# Patient Record
Sex: Female | Born: 1955 | Race: White | Hispanic: No | Smoking: Never smoker
Health system: Southern US, Community
[De-identification: ages and names within clinical notes are randomized; demographics above are authoritative.]

## PROBLEM LIST (undated history)

## (undated) DIAGNOSIS — F419 Anxiety disorder, unspecified: Secondary | ICD-10-CM

## (undated) DIAGNOSIS — F32A Depression, unspecified: Secondary | ICD-10-CM

## (undated) DIAGNOSIS — K219 Gastro-esophageal reflux disease without esophagitis: Secondary | ICD-10-CM

## (undated) DIAGNOSIS — G4733 Obstructive sleep apnea (adult) (pediatric): Secondary | ICD-10-CM

## (undated) DIAGNOSIS — G4730 Sleep apnea, unspecified: Secondary | ICD-10-CM

## (undated) DIAGNOSIS — Z9641 Presence of insulin pump (external) (internal): Secondary | ICD-10-CM

## (undated) DIAGNOSIS — F329 Major depressive disorder, single episode, unspecified: Secondary | ICD-10-CM

## (undated) DIAGNOSIS — T7840XA Allergy, unspecified, initial encounter: Secondary | ICD-10-CM

## (undated) DIAGNOSIS — D126 Benign neoplasm of colon, unspecified: Secondary | ICD-10-CM

## (undated) DIAGNOSIS — E785 Hyperlipidemia, unspecified: Secondary | ICD-10-CM

## (undated) DIAGNOSIS — D649 Anemia, unspecified: Secondary | ICD-10-CM

## (undated) HISTORY — DX: Hyperlipidemia, unspecified: E78.5

## (undated) HISTORY — DX: Anxiety disorder, unspecified: F41.9

## (undated) HISTORY — DX: Anemia, unspecified: D64.9

## (undated) HISTORY — DX: Gastro-esophageal reflux disease without esophagitis: K21.9

## (undated) HISTORY — DX: Sleep apnea, unspecified: G47.30

## (undated) HISTORY — PX: EYE SURGERY: SHX253

## (undated) HISTORY — DX: Depression, unspecified: F32.A

## (undated) HISTORY — PX: OTHER SURGICAL HISTORY: SHX169

## (undated) HISTORY — DX: Benign neoplasm of colon, unspecified: D12.6

## (undated) HISTORY — DX: Obstructive sleep apnea (adult) (pediatric): G47.33

## (undated) HISTORY — DX: Major depressive disorder, single episode, unspecified: F32.9

## (undated) HISTORY — PX: COLONOSCOPY: SHX174

## (undated) HISTORY — DX: Allergy, unspecified, initial encounter: T78.40XA

---

## 1998-01-11 ENCOUNTER — Other Ambulatory Visit: Admission: RE | Admit: 1998-01-11 | Discharge: 1998-01-11 | Payer: Self-pay | Admitting: Gynecology

## 1998-05-01 HISTORY — PX: CARPAL TUNNEL RELEASE: SHX101

## 1999-02-17 ENCOUNTER — Other Ambulatory Visit: Admission: RE | Admit: 1999-02-17 | Discharge: 1999-02-17 | Payer: Self-pay | Admitting: Gynecology

## 1999-03-03 ENCOUNTER — Encounter: Admission: RE | Admit: 1999-03-03 | Discharge: 1999-06-01 | Payer: Self-pay | Admitting: Endocrinology

## 2000-01-11 ENCOUNTER — Ambulatory Visit (HOSPITAL_BASED_OUTPATIENT_CLINIC_OR_DEPARTMENT_OTHER): Admission: RE | Admit: 2000-01-11 | Discharge: 2000-01-11 | Payer: Self-pay | Admitting: Orthopedic Surgery

## 2000-03-01 ENCOUNTER — Other Ambulatory Visit: Admission: RE | Admit: 2000-03-01 | Discharge: 2000-03-01 | Payer: Self-pay | Admitting: Gynecology

## 2000-03-28 ENCOUNTER — Ambulatory Visit (HOSPITAL_BASED_OUTPATIENT_CLINIC_OR_DEPARTMENT_OTHER): Admission: RE | Admit: 2000-03-28 | Discharge: 2000-03-28 | Payer: Self-pay | Admitting: Orthopedic Surgery

## 2001-03-21 ENCOUNTER — Other Ambulatory Visit: Admission: RE | Admit: 2001-03-21 | Discharge: 2001-03-21 | Payer: Self-pay | Admitting: Gynecology

## 2002-04-01 ENCOUNTER — Other Ambulatory Visit: Admission: RE | Admit: 2002-04-01 | Discharge: 2002-04-01 | Payer: Self-pay | Admitting: Gynecology

## 2003-03-05 ENCOUNTER — Ambulatory Visit (HOSPITAL_BASED_OUTPATIENT_CLINIC_OR_DEPARTMENT_OTHER): Admission: RE | Admit: 2003-03-05 | Discharge: 2003-03-05 | Payer: Self-pay | Admitting: Orthopedic Surgery

## 2003-03-05 ENCOUNTER — Ambulatory Visit (HOSPITAL_COMMUNITY): Admission: RE | Admit: 2003-03-05 | Discharge: 2003-03-05 | Payer: Self-pay | Admitting: Orthopedic Surgery

## 2003-04-28 ENCOUNTER — Other Ambulatory Visit: Admission: RE | Admit: 2003-04-28 | Discharge: 2003-04-28 | Payer: Self-pay | Admitting: Gynecology

## 2004-04-27 ENCOUNTER — Other Ambulatory Visit: Admission: RE | Admit: 2004-04-27 | Discharge: 2004-04-27 | Payer: Self-pay | Admitting: Gynecology

## 2004-09-14 ENCOUNTER — Ambulatory Visit (HOSPITAL_COMMUNITY): Admission: RE | Admit: 2004-09-14 | Discharge: 2004-09-14 | Payer: Self-pay | Admitting: Orthopedic Surgery

## 2004-09-14 ENCOUNTER — Ambulatory Visit (HOSPITAL_BASED_OUTPATIENT_CLINIC_OR_DEPARTMENT_OTHER): Admission: RE | Admit: 2004-09-14 | Discharge: 2004-09-14 | Payer: Self-pay | Admitting: Orthopedic Surgery

## 2005-05-01 HISTORY — PX: APPENDECTOMY: SHX54

## 2005-06-19 ENCOUNTER — Other Ambulatory Visit: Admission: RE | Admit: 2005-06-19 | Discharge: 2005-06-19 | Payer: Self-pay | Admitting: Gynecology

## 2005-10-04 ENCOUNTER — Other Ambulatory Visit: Admission: RE | Admit: 2005-10-04 | Discharge: 2005-10-04 | Payer: Self-pay | Admitting: Gynecology

## 2006-02-22 ENCOUNTER — Other Ambulatory Visit: Admission: RE | Admit: 2006-02-22 | Discharge: 2006-02-22 | Payer: Self-pay | Admitting: Gynecology

## 2006-07-05 ENCOUNTER — Other Ambulatory Visit: Admission: RE | Admit: 2006-07-05 | Discharge: 2006-07-05 | Payer: Self-pay | Admitting: Gynecology

## 2006-08-14 ENCOUNTER — Inpatient Hospital Stay (HOSPITAL_COMMUNITY): Admission: EM | Admit: 2006-08-14 | Discharge: 2006-08-16 | Payer: Self-pay | Admitting: Emergency Medicine

## 2006-08-15 ENCOUNTER — Encounter (INDEPENDENT_AMBULATORY_CARE_PROVIDER_SITE_OTHER): Payer: Self-pay | Admitting: Specialist

## 2007-07-10 ENCOUNTER — Ambulatory Visit: Payer: Self-pay | Admitting: Internal Medicine

## 2007-08-19 ENCOUNTER — Other Ambulatory Visit: Admission: RE | Admit: 2007-08-19 | Discharge: 2007-08-19 | Payer: Self-pay | Admitting: Gynecology

## 2007-08-23 ENCOUNTER — Ambulatory Visit: Payer: Self-pay | Admitting: Internal Medicine

## 2007-08-23 ENCOUNTER — Encounter: Payer: Self-pay | Admitting: Internal Medicine

## 2010-09-13 NOTE — Assessment & Plan Note (Signed)
[facility]                         GASTROENTEROLOGY OFFICE NOTE   Emily Combs, Emily Combs                     MRN:          [id number]  DATE:07/10/2007                            DOB:          10-Jun-1955    REFERRING PHYSICIAN:  Tera Mater. [name], M.D.   REASON FOR CONSULTATION:  Screening colonoscopy in a type I insulin-  requiring diabetic.   HISTORY:  This is a pleasant 55 year old white female with longstanding  type 1 diabetes mellitus. She is referred through the courtesy of Dr.  [name] regarding screening colonoscopy. The patient denies any active  gastrointestinal complaints such as abdominal pain, change in bowel  habits, or bleeding. She has not undergone prior screening. She does  have history of colon cancer in two second-degree relatives including  her paternal grandmother and maternal uncle. For her diabetes, the  patient is on an insulin pump.   PAST MEDICAL HISTORY:  Type 1 diabetes.   PAST SURGICAL HISTORY:  Status post appendectomy.   ALLERGIES:  No known drug allergies.   CURRENT MEDICATIONS:  1. Vytorin 10/20 mg daily.  2. Novolog insulin pump.  3. Symlin 15 units before meals.  4. Aspirin 325 mg daily.  5. Aleve 3 each morning.   FAMILY HISTORY:  Paternal grandmother with colon cancer and maternal  uncle with colon cancer.   SOCIAL HISTORY:  The patient is separated with one son. 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 She  does not smoke or use alcohol.   REVIEW OF SYSTEMS:  Per diagnostic evaluation form.   PHYSICAL EXAMINATION:  Well-appearing female in no acute distress. Blood  pressure is 96/56. Heart rate is 80. Weight is 150.6 pounds. She is 5  feet 3 inches in height.  HEENT:  Sclerae anicteric. Conjunctivae are pink. There is no  adenopathy.  LUNGS:  Clear.  HEART:  Regular.  ABDOMEN:  Soft without tenderness, masses, or hernia. Good bowel sounds  heard.   IMPRESSION:  1. Screening colonoscopy. The patient is an appropriate candidate      without contraindication. The nature of the procedure as well as      the risks, benefits, and alternatives were reviewed. She understood      and agreed to proceed.  2. Type 1 insulin-requiring diabetic.  3. Family history of colon cancer in two second-degree relatives.   RECOMMENDATIONS:  The patient is agreeable and interested in colonoscopy  as discussed above. With regards to her insulin, some adjustments may  need to be made to avoid unwanted hypoglycemia. She will discuss this  specifically with Dr. [name].     Wilhemina Bonito. Marina Goodell, MD  Electronically Signed    JNP/MedQ  DD: 07/10/2007  DT: 07/11/2007  Job #: [id number]   cc:   Jeannett Senior A. [name], M.D.

## 2010-09-16 NOTE — Op Note (Signed)
Emily Combs, Emily Combs              ACCOUNT NO.:  [id number]   MEDICAL RECORD NO.:  [id number]          PATIENT TYPE:  AMB   LOCATION:  DSC                          FACILITY:  [facility]   PHYSICIAN:  Cindee Salt, M.D.       DATE OF BIRTH:  Aug 07, 1955   DATE OF PROCEDURE:  09/14/2004  DATE OF DISCHARGE:                                 OPERATIVE REPORT   PREOPERATIVE DIAGNOSIS:  Stenosing tenosynovitis, right index and right ring  finger.   POSTOPERATIVE DIAGNOSIS:  Stenosing tenosynovitis, right index and right  ring finger.   OPERATION:  Release A1 pulley, right ring and right index finger.   SURGEON:  Cindee Salt, M.D.   ASSISTANT:  Carolyne Fiscal, R.N.   ANESTHESIA:  Forearm-based IV regional.   HISTORY:  The patient is a 55 year old female with a history of triggering  of her right index and  right ring fingers. She is a diabetic and had  elected to proceed with releases rather than injections.   PROCEDURE:  The patient was brought to the operating room where forearm-  based IV regional anesthetic was carried out without difficulty. She was  prepped using DuraPrep, supine position, the right arm free.  An oblique  incision was made over each one of the metacarpophalangeal joints of the  index and ring fingers separately, carried down through subcutaneous tissue.  Neurovascular structures identified, protected, and retracted. Significant  fibrosis was present around the entire area. The A1 pulley was then  isolated.  Each was released on the radial aspect and a small incision made  in the central aspect of the A2 pulley.  The fingers were placed through a  full range motion.  No further triggering was evident. The wounds were  separately irrigated with saline and separately closed with interrupted 5-0  nylon sutures. A sterile compressive dressing was applied. The patient  tolerated the procedure well and was taken to the recovery room for  observation in satisfactory condition. She is  discharged home to return to  the [facility] [place] in one week on Vicodin.      GK/MEDQ  D:  09/14/2004  T:  09/14/2004  Job:  [id number]

## 2010-09-16 NOTE — H&P (Signed)
Emily Combs, Emily Combs              ACCOUNT NO.:  [id number]   MEDICAL RECORD NO.:  [id number]          PATIENT TYPE:  EMS   LOCATION:  MAJO                         FACILITY:  [facility]   PHYSICIAN:  Cherylynn Ridges, M.D.    DATE OF BIRTH:  09-22-1955   DATE OF ADMISSION:  08/14/2006  DATE OF DISCHARGE:                              HISTORY & PHYSICAL   CHIEF COMPLAINT:  The patient is a 55 year old female who comes in with  x-rays demonstrating acute appendicitis and an appendolith.   HISTORY OF PRESENT ILLNESS:  The patient is an insulin pump dependent  diabetic treated by Dr. [name]. She has had abdominal pain for  approximately 24 hours.  Went in to today to have a CT scan done late in  the day.  She had nausea but not any vomiting.  When she was seen by  primary care, she was sent for a CT scan which was finished after hours.  It demonstrated acute appendicitis with an appendolith and surgery was  consulted.   PAST MEDICAL HISTORY:  Significant for insulin pump dependent diabetes  mellitus, currently 1.625 units per hour with sugar at 83.  She tells me  she takes no other medications.   ALLERGIES:  NO KNOWN DRUG ALLERGIES.   PAST SURGICAL HISTORY:  She has had a cesarean section before, no other  surgery.   MEDICATIONS:  Include insulin in her insulin pump. She takes no long  term or long acting insulin.   She has had no diarrhea or constipation.   PHYSICAL EXAMINATION:  She is afebrile.  Her other vital signs are  stable.  She is normocephalic, atraumatic. Nonicteric.  NECK:  Supple.  CHEST:  Clear to auscultation and percussion.  CARDIAC EXAM:  Regular rate and rhythm.  No rubs, murmurs or gallops.  ABDOMEN:  Soft and tender in the right lower quadrant and mid portion.  Her insulin pump is inserted on the left side where she moved it today  after experiencing the pain on the right side thinking that it may be a  pump associated abscess.  She has guarding and tenderness and a  positive  Rovsing's sign.  RECTAL EXAM:  Not performed.   LABORATORY DATA:  White count is 15,000.  Hemoglobin is elevated.  Her  hematocrit I believe is 36.  BUN creatinine were normal.  I reviewed the  CT scan brought with her by disc which demonstrates thickened appendix  in the right lower quadrant.   PLAN:  Plan is to perform a laparoscopic appendectomy.  This may need to  be converted to an open based on the size of the appendix and the amount  of the acute inflammation.  She will get preoperative Unasyn therapy and  continue that postoperatively depending on the amount of inflammation  and whether or not this is ruptured.  Currently this does not appear to  be ruptured.  She will be maintained in the hospital and started back on  her insulin pump.  Postoperatively with the D5LR drip being run.      Cherylynn Ridges, M.D.  Electronically Signed     JOW/MEDQ  D:  08/14/2006  T:  08/15/2006  Job:  [id number]   cc:   Geoffry Paradise, M.D.  Tera Mater. [name], M.D.

## 2010-09-16 NOTE — Op Note (Signed)
[place]. [facility]  Patient:    Emily Combs, Emily Combs                     MRN: [id number] Proc. Date: 03/28/00 Attending:  Ronne Binning                           Operative Report  PREOPERATIVE DIAGNOSIS:  Carpal tunnel syndrome, left hand.  POSTOPERATIVE DIAGNOSIS:  Carpal tunnel syndrome, left hand.  OPERATION:  Carpal tunnel release, left hand.  SURGEON:  Nicki Reaper, M.D.  ASSISTANT:  Joaquin Courts, R.N.  ANESTHESIA:  Forearm-based IV regional.  ANESTHESIOLOGIST:  Edwin Cap. Zoila Shutter, M.D.  HISTORY:  The patient is a 55 year old female with a history of carpal tunnel syndrome, EMG nerve conductions positive, which has not responded to conservative treatment.  DESCRIPTION OF PROCEDURE:  The patient was brought to the operating room, where a forearm-based IV regional anesthetic was carried out without difficulty.  She was prepped and draped using Betadine scrub and solution with the left arm free.  A longitudinal incision was made in the palm, carried down through subcutaneous tissue.  Bleeders were electrocauterized.  Palmar fascia was clipped.  Superficial palmar arch identified.  The flexor tendons of the ring and little finger identified to the ulnar side of the median nerve.  The carpal retinaculum was incised with sharp dissection.  A right angle and Sewell retractor were placed between the skin and forearm fascia.  The fascia was released for approximately 3 cm proximal to the wrist crease under direct vision.  The canal was explored and no further lesions were identified. Tenosynovial tissue was moderately thickened.  The wound was irrigated, and the skin was closed with interrupted 5-0 nylon suture.  A sterile compressive dressing and splint were applied.  The patient tolerated the procedure well and was taken to the recovery room for observation in satisfactory condition. She is discharged home, to return to the [facility] of  [place] in one week, on Vicodin and Keflex. DD:  03/28/00 TD:  03/28/00 Job: [id number]

## 2010-09-16 NOTE — Discharge Summary (Signed)
Emily Combs, [name]              ACCOUNT NO.:  [id number]   MEDICAL RECORD NO.:  [id number]          PATIENT TYPE:  INP   LOCATION:  5511                         FACILITY:  [facility]   PHYSICIAN:  Anselm Pancoast. Weatherly, M.D.DATE OF BIRTH:  [birth year]   DATE OF ADMISSION:  08/14/2006  DATE OF DISCHARGE:  08/16/2006                               DISCHARGE SUMMARY   CHIEF COMPLAINT AND REASON FOR ADMISSION:  The patient is a 55 year old  female patient insulin-dependent diabetes on a pump, followed by Dr.  [name] who presented to the ER with abdominal pain for 24 hours.  Came to  the hospital to have a CT scan done, this was done after hours.  The CT  scan demonstrated acute appendicitis with an appendicolith.  She was  eventually evaluated in the ER at [facility].  Her white cell count was  elevated at 15,000.  CT scan was reviewed by Dr. [name] and he felt she  had a thickened appendix in the right lower quadrant consistent with  acute appendicitis.  The patient was admitted with a diagnosis of acute  appendicitis as well as known insulin dependent diabetes on a pump.   HOSPITAL COURSE:  The patient was taken from the ER to the OR with plans  for her to proceed with laparoscopic appendectomy.  She was given Unasyn  preoperatively.  She subsequently underwent a laparoscopic appendectomy  with the diagnosis of acute nonperforated appendicitis with  appendicolith.  She was stable and sent to the general floor to recover.   The following morning the patient was afebrile, vital signs were stable.  She was hungry but sore.  Her abdomen was soft, bowel sounds were  present.  She had already been tolerating a clear liquid diet.  Her diet  was advanced.  She was given Toradol and started on oral pain  medications.  She tolerated this and was deemed appropriate for  discharge home.   FINAL DISCHARGE DIAGNOSES:  1. Acute nonperforated appendicitis.  2. Status post laparoscopic appendectomy.  3.  Insulin dependent diabetes on IV insulin pump.   DISCHARGE MEDICATIONS:  1. Vytorin at prior dose.  2. Aspirin 81 mg daily.  3. Naprosyn 3 pills daily or over-the-counter ibuprofen as needed in      addition to her Vicodin.  4. Insulin pump as previous.  5. Sinulin as before.  6. Vicodin 5/325 1-2 tablets every 4 hours as needed for pain.   Return to work in 2 weeks, sooner if you desire and1 week if you are not  requiring use of Vicodin for pain.   DIET:  Low sodium heart-healthy with diabetic low carb precautions.   ACTIVITY:  Increase activity slowly.  May walk up steps, may shower.  No  lifting greater than 15 pounds for 2 weeks, no driving while taking the  Vicodin.   WOUND CARE:  Allow Steri-Strips to fall off.   OTHER INSTRUCTIONS:  Call surgeon if:  A.  Fever greater than or equal  to 101 degrees Fahrenheit.  B. Nausea, vomiting, diarrhea.  C.  New or  increased belly pain.  D.  Redness or drainage from the wounds.   In regards to work, as listed above and may return to work after  discharge of works at home with limited lifting.   FOLLOW UP:  You need to call Dr. [name] at [phone] to be seen in 2  weeks.      Allison L. Rennis Harding, N.P.    ______________________________  Anselm Pancoast. Zachery Dakins, M.D.    ALE/MEDQ  D:  10/03/2006  T:  10/03/2006  Job:  [id number]   cc:   Anselm Pancoast. Zachery Dakins, M.D.  Dr. [name]

## 2010-09-16 NOTE — Op Note (Signed)
[place]. [facility]  Patient:    Emily Combs, Emily Combs                     MRN: [id number] Proc. Date: 01/11/00 Adm. Date:  [id number] Attending:  Ronne Binning                           Operative Report  PREOPERATIVE DIAGNOSIS:  Carpal tunnel syndrome, right hand.  POSTOPERATIVE DIAGNOSIS:  Carpal tunnel syndrome, right hand.  OPERATION:  Release right carpal tunnel.  SURGEON:  Nicki Reaper, M.D.  ASSISTANT:  Joaquin Courts, R.N.  ANESTHESIA:  Foreign based IV regional.  ANESTHESIOLOGIST:  Halford Decamp, M.D.  HISTORY:  The patient is a 55 year old female with a history of carpal tunnel syndrome.  EMG nerve conductions positive which have not responded to conservative treatment.  DESCRIPTION OF PROCEDURE:  The patient was brought to the operating room where a foreign based IV regional anesthetic was carried out without difficulty. She was prepped and draped using Betadine scrub and solution with the right arm free.  A longitudinal incision was made in the palm and carried down through the subcutaneous tissues.  Bleeders were electrocauterized.  Palmar fascia was split.  The superficial palmar arch was identified.  The flexor tendon to the ring and little finger were identified.  At the ulnar side of the median nerve, the carpal retinaculum was incised with sharp dissection. Right angle and Sewall retractor were placed between skin and forearm fascia. The fascia was released for approximately 3 cm proximal to the wrist crease and this was not fully visualized.  A small incision was made proximally to allow completion of the release of the distal forearm fascia under direct vision.  The canal was explored.  No further lesions were identified.  The wound was irrigated.  The skin was closed with interrupted 5-0 nylon suture. Sterile compressive dressing and splint was applied.  The patient tolerated the procedure well and was taken to the recovery  room for observation in satisfactory condition.  She was discharged home to return to the [facility] [place] in one week on Vicodin and Keflex. DD:  01/11/00 TD:  01/12/00 Job: [id number]

## 2010-09-16 NOTE — Op Note (Signed)
Emily Combs, Emily Combs              ACCOUNT NO.:  [id number]   MEDICAL RECORD NO.:  [id number]          PATIENT TYPE:  EMS   LOCATION:  MAJO                         FACILITY:  [facility]   PHYSICIAN:  Cherylynn Ridges, M.D.    DATE OF BIRTH:  1956/03/07   DATE OF PROCEDURE:  08/15/2006  DATE OF DISCHARGE:                               OPERATIVE REPORT   PREOPERATIVE DIAGNOSIS:  Acute appendicitis with large appendicolith.   POSTOPERATIVE DIAGNOSIS:  Acute appendicitis with large appendicolith  without rupture.   PROCEDURE:  Laparoscopic appendectomy.   SURGEON:  Cherylynn Ridges, M.D.   ANESTHESIA:  General endotracheal.   ESTIMATED BLOOD LOSS:  Less than 30 mL.   COMPLICATIONS:  None.   CONDITION:  Stable.   INDICATIONS FOR OPERATION:  The patient is a 55 year old who came with a  CT scan demonstrating acute appendicitis with an appendicolith.   FINDINGS:  The patient had a large appendix with acute inflammation.  No  evidence of rupture.   OPERATION:  The patient was taken to the operating room, placed on table  in supine position.  After an adequate general anesthetic was  administered, she was prepped and draped in usual sterile manner  exposing the midline and right upper quadrant.   A supraumbilical curvilinear incision was made using #11 blade taken  down to the midline fascia.  We grabbed the fascia with Kocher clamps  and split it between with a 15 blade down into the preperitoneal fat.  We tented up on the fascia using Kocher clamps and then bluntly  dissected down into the peritoneal cavity with a Kelly clamp.   We passed a pursestring suture of 0-0 Vicryl around the fascial incision  and passed Hasson cannula into the peritoneal cavity.  We secured it in  place with a pursestring.  Once this was done, a right upper quadrant 5-  mm cannula and a suprapubic 12-mm cannula passed under direct vision  into the peritoneal cavity.  The one in the suprapubic area took some  force and dissection to get through because of previous scarring from  previous C-section.   The patient had no hematuria during the case to indicate possible  bladder injury with trocar, none was noted.   The appendix was large and attached to the lateral walls as was the  cecum.  We dissected out the lateral attachments using electrocautery  then dissected out the base of the appendix and separated it away from  the base of the cecum.  We came across the base using an Endo-GIA with  3.5-mm closure staples.  We then dissected out the mesoappendix and came  across it with 2.5 mm Endo-GIA where there was excellent hemostasis.  We  used an EndoCatch bag to bring it out through the suprapubic site with  minimal difficulty without tearing the bag or rupture or contamination  of the subcutaneous tissue.   Once this was done we used about 2 liters saline to irrigate the right  lower quadrant aspirated all fluid.  Once this was done, we removed all  cannulas.  The supraumbilical  fascial site was closed using the  pursestring suture which was in place.  0.25% Marcaine with epi was  injected all sites and all sites were closed using running subcuticular  stitch of 5-0 Vicryl.  All needle counts, sponge counts and instrument  counts were correct.  Sterile dressings were applied.      Cherylynn Ridges, M.D.  Electronically Signed     JOW/MEDQ  D:  08/15/2006  T:  08/15/2006  Job:  [id number]

## 2010-09-16 NOTE — Op Note (Signed)
[name], Emily Combs                        ACCOUNT NO.:  [id number]   MEDICAL RECORD NO.:  [id number]                   PATIENT TYPE:  AMB   LOCATION:  DSC                                  FACILITY:  [facility]   PHYSICIAN:  Cindee Salt, M.D.                    DATE OF BIRTH:  07/27/1955   DATE OF PROCEDURE:  03/05/2003  DATE OF DISCHARGE:                                 OPERATIVE REPORT   PREOPERATIVE DIAGNOSIS:  Stenosing tenosynovitis, left index and left ring  fingers.   POSTOPERATIVE DIAGNOSIS:  Stenosing tenosynovitis, left index and left ring  fingers.   OPERATION:  Release of A-1 pulleys, left index and left ring fingers.   SURGEON:  Cindee Salt, M.D.   ASSISTANT:  Alfredo Bach.   ANESTHESIA:  Forearm-based IV regional.   HISTORY:  The patient is a 55 year old female with a history of triggering  of her left index and left ring fingers which has not responded to  conservative treatment.  She also has a proximal fibroma on the ring finger  which may represent an early Dupuytren's.  We have discussed possible  excision of this with her and have decided against resection of that  component at this point in time.   PROCEDURE:  The patient was brought to the operating room where a forearm-  based IV regional anesthetic was carried out without difficulty.  She was  prepped using DuraPrep, supine position, left arm free.  An oblique incision  was made over the A-1 pulleys at the left ring and left index finger and  carried down through subcutaneous tissue.  Bleeders were electrocauterized  and neurovascular structures on both radial and ulnar sides of each flexor  sheath were protected.  An incision was made on the radial aspect of the A-1  pulley.  The A-1 pulley was entirely released.  A small incision was made in  the central aspect of the A-2 pulley, the finger placed through a full range  of motion and no further triggering was identified.  It was noted that the  pulleys  were extremely thick on both index and ring fingers; each were  released separately.  The wound was irrigated.  The skin was closed with  interrupted 5-0 nylon suture.  A sterile compressive dressing was applied.  The patient tolerated the procedure well and was taken to the recovery room  for observation in satisfactory condition.   She is discharged home to return to the [facility] [place] in one  week, on Vicodin and Keflex.                                               Cindee Salt, M.D.    [name]  D:  03/05/2003  T:  03/05/2003  Job:  [id number]

## 2010-10-08 ENCOUNTER — Encounter: Payer: Self-pay | Admitting: Internal Medicine

## 2011-01-30 HISTORY — PX: WRIST SURGERY: SHX841

## 2011-02-15 ENCOUNTER — Encounter (HOSPITAL_BASED_OUTPATIENT_CLINIC_OR_DEPARTMENT_OTHER)
Admission: RE | Admit: 2011-02-15 | Discharge: 2011-02-15 | Disposition: A | Discharge status: Home / Self Care | Payer: BC Managed Care – PPO | Source: Ambulatory Visit | Attending: Orthopedic Surgery | Admitting: Orthopedic Surgery

## 2011-02-15 LAB — BASIC METABOLIC PANEL
Calcium: 10 mg/dL (ref 8.4–10.5)
GFR calc non Af Amer: >90 mL/min (ref >90)
Glucose, Bld: 177 mg/dL — ABNORMAL HIGH (ref 70–99)
Sodium: 137 mEq/L (ref 135–145)

## 2011-02-17 ENCOUNTER — Ambulatory Visit (HOSPITAL_BASED_OUTPATIENT_CLINIC_OR_DEPARTMENT_OTHER)
Admission: RE | Admit: 2011-02-17 | Discharge: 2011-02-17 | Disposition: A | Discharge status: Home / Self Care | Payer: BC Managed Care – PPO | Source: Ambulatory Visit | Attending: Orthopedic Surgery | Admitting: Orthopedic Surgery

## 2011-02-17 DIAGNOSIS — E669 Obesity, unspecified: Secondary | ICD-10-CM | POA: Insufficient documentation

## 2011-02-17 DIAGNOSIS — Z794 Long term (current) use of insulin: Secondary | ICD-10-CM | POA: Insufficient documentation

## 2011-02-17 DIAGNOSIS — Z01812 Encounter for preprocedural laboratory examination: Secondary | ICD-10-CM | POA: Insufficient documentation

## 2011-02-17 DIAGNOSIS — Z9641 Presence of insulin pump (external) (internal): Secondary | ICD-10-CM | POA: Insufficient documentation

## 2011-02-17 DIAGNOSIS — E119 Type 2 diabetes mellitus without complications: Secondary | ICD-10-CM | POA: Insufficient documentation

## 2011-02-17 DIAGNOSIS — M65839 Other synovitis and tenosynovitis, unspecified forearm: Secondary | ICD-10-CM | POA: Insufficient documentation

## 2011-02-17 DIAGNOSIS — M65849 Other synovitis and tenosynovitis, unspecified hand: Secondary | ICD-10-CM | POA: Insufficient documentation

## 2011-02-17 LAB — POCT HEMOGLOBIN-HEMACUE: Hemoglobin: 12.1 g/dL (ref 12.0–15.0)

## 2011-03-02 NOTE — Op Note (Signed)
NAMEJAMILYNN, WHITACRE                ACCOUNT NO.:  [id number]  MEDICAL RECORD NO.:  [id number]  LOCATION:                                 FACILITY:  PHYSICIAN:  Katy Fitch. Orianna Biskup, M.D.      DATE OF BIRTH:  DATE OF PROCEDURE:  02/17/2011 DATE OF DISCHARGE:                              OPERATIVE REPORT   PREOPERATIVE DIAGNOSIS:  Chronic stenosing tenosynovitis, right first dorsal compartment, associated with insulin-dependent diabetes, status post unsuccessful injection, January 04, 2011, followed by prolonged splinting.  POSTOPERATIVE DIAGNOSIS:  Chronic stenosing tenosynovitis, right first dorsal compartment, associated with insulin-dependent diabetes, status post unsuccessful injection, January 04, 2011, followed by prolonged splinting.  OPERATION:  Release of right first dorsal compartment.  OPERATING SURGEON:  Katy Fitch. Ruari Mudgett, MD  ASSISTANT:  Marveen Reeks Dasnoit, PA-C  ANESTHESIA:  2% lidocaine, field block, supplemented by IV sedation.  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 Adrian Prince, for evaluation and management of chronic stenosing tenosynovitis of her right first dorsal compartment.  She is a insulin-dependent diabetic who uses an insulin pump.  She has had prior release of her left first dorsal compartment with an excellent result.  She now presents with a painful right first dorsal compartment with a positive Finkelstein maneuver.  We tried nonoperative measures including a steroid injection and splinting.  This was unsuccessful.  She now presents for release of a right first dorsal compartment under local anesthesia and sedation.  PROCEDURE:  [name] was brought to room 1 of the [facility] and placed in supine position upon the operating table. Following anesthesia and informed consent with Dr. Sampson Goon,  monitored anesthesia care was recommended and accepted.  After informed consent and Betadine prep, 2% lidocaine was infiltrated into the path intended incision at the first dorsal compartment and in the compartment proper. After a few moments, anesthesia was satisfactory.  The right arm and hand were then prepped with Betadine soap and solution, and sterilely draped.  A pneumatic tourniquet was applied to the proximal right brachium.  Following exsanguination of the right arm with an Esmarch bandage, the arterial tourniquet was inflated to 250 mmHg due to mild systolic hypertension.  Following routine surgical time-out, procedure commenced with a short transverse incision over the apex of the swollen compartment.  The subcutaneous tissues were carefully divided taking care to gently identify and retract the radial superficial sensory branches.  The compartment was then split longitudinally with scalpel and scissors.  The contents of the compartment were studied.  There was a large slip of the abductor pollicis longus and a smaller slip of the extensor pollicis brevis.  There was a septum separating the 2 tendon compartments that was incised and resected with a rongeur.  Bleeding points were electrocauterized, followed by repair of the skin with intradermal 3-0 Prolene.  A compressive dressing was applied with a Steri-Strip, sterile gauze, and Ace bandage.  We will encourage Ms. Minogue to begin immediate range of motion exercises to her fingers, thumb, and wrist.  We will see her back for followup  in 1 week for suture removal.  She will continue with a routine glucose control with her insulin pump.  Her preoperative serum glucose was 191.  Her insulin pump was running while she was on lactated Ringer's IV solution intraoperatively.     Katy Fitch Camielle Sizer, M.D.     RVS/MEDQ  D:  02/17/2011  T:  02/17/2011  Job:  [id number]  cc:   Jeannett Senior A. [name], M.D.  Electronically Signed by  Josephine Igo M.D. on 03/02/2011 05:19:46 PM

## 2011-03-30 ENCOUNTER — Encounter: Payer: Self-pay | Admitting: Internal Medicine

## 2011-04-20 ENCOUNTER — Telehealth: Payer: Self-pay | Admitting: *Deleted

## 2011-04-20 ENCOUNTER — Ambulatory Visit (AMBULATORY_SURGERY_CENTER): Payer: BC Managed Care – PPO | Admitting: *Deleted

## 2011-04-20 VITALS — Ht 63.0 in | Wt 168.0 lb

## 2011-04-20 DIAGNOSIS — Z1211 Encounter for screening for malignant neoplasm of colon: Secondary | ICD-10-CM

## 2011-04-20 MED ORDER — PEG-KCL-NACL-NASULF-NA ASC-C 100 G PO SOLR: ORAL | Status: DC

## 2011-04-20 NOTE — Telephone Encounter (Signed)
Pt. Having colon on 05/04/11 and has an insulin pump.  Please contact PCP for instructions on regulating insulin .  Thank you.  Wyona Almas

## 2011-04-21 ENCOUNTER — Telehealth: Payer: Self-pay

## 2011-04-21 NOTE — Telephone Encounter (Signed)
Left message with [place] regarding insulin pump instructions for patient before colon

## 2011-05-02 HISTORY — PX: CATARACT EXTRACTION: SUR2

## 2011-05-04 ENCOUNTER — Ambulatory Visit (AMBULATORY_SURGERY_CENTER): Payer: BC Managed Care – PPO | Admitting: Internal Medicine

## 2011-05-04 ENCOUNTER — Encounter: Payer: Self-pay | Admitting: Internal Medicine

## 2011-05-04 VITALS — BP 112/68 | HR 94 | Temp 98.2°F | Resp 20 | Ht 63.0 in | Wt 168.0 lb

## 2011-05-04 DIAGNOSIS — Z1211 Encounter for screening for malignant neoplasm of colon: Secondary | ICD-10-CM

## 2011-05-04 DIAGNOSIS — D126 Benign neoplasm of colon, unspecified: Secondary | ICD-10-CM

## 2011-05-04 DIAGNOSIS — Z86010 Personal history of colonic polyps: Secondary | ICD-10-CM

## 2011-05-04 LAB — GLUCOSE, CAPILLARY: Glucose-Capillary: 129 mg/dL — ABNORMAL HIGH (ref 70–99)

## 2011-05-04 MED ORDER — SODIUM CHLORIDE 0.9 % IV SOLN: 500.0000 mL | INTRAVENOUS | Status: DC

## 2011-05-04 NOTE — Op Note (Signed)
[facility] [address] [place], [place]  [address]  COLONOSCOPY PROCEDURE REPORT  PATIENT:  Malloree, Raboin  MR#:  [id number] BIRTHDATE:  12-Mar-1956, 55 yrs. old  GENDER:  female ENDOSCOPIST:  Wilhemina Bonito. Eda Keys, MD REF. BY:  Surveillance Program Recall, PROCEDURE DATE:  05/04/2011 PROCEDURE:  Colonoscopy with snare polypectomy x 1 ASA CLASS:  Class III INDICATIONS:  history of pre-cancerous (adenomatous) colon polyps, surveillance and high-risk screening ; index exam 07-2007 w/ 15mm TVA MEDICATIONS:   MAC sedation, administered by CRNA, propofol (Diprivan) 300 mg IV  DESCRIPTION OF PROCEDURE:   After the risks benefits and alternatives of the procedure were thoroughly explained, informed consent was obtained.  Digital rectal exam was performed and revealed no abnormalities.   The LB CF-[id number] endoscope was introduced through the anus and advanced to the cecum, which was identified by both the appendix and ileocecal valve, without limitations.  The quality of the prep was good, using MoviPrep. The instrument was then slowly withdrawn as the colon was fully examined. <<PROCEDUREIMAGES>>  FINDINGS:  A diminutive polyp was found in the proximal transverse colon and snared without cautery. Retrieval was successful. Otherwise normal colonoscopy without other polyps, masses, vascular ectasias, or inflammatory changes.   Retroflexed views in the rectum revealed internal hemorrhoids.    The time to cecum = 3:08 minutes. The scope was then withdrawn in 17:14  minutes from the cecum and the procedure completed.  COMPLICATIONS:  None  ENDOSCOPIC IMPRESSION: 1) Diminutive polyp in the proximal transverse colon - removed 2) Otherwise normal colonoscopy 3) Small Internal hemorrhoids  RECOMMENDATIONS: 1) Follow up colonoscopy in 5 years  ______________________________ Wilhemina Bonito. Eda Keys, MD  CC:  Adrian Prince, MD;  The Patient  n. eSIGNED:   Wilhemina Bonito. Eda Keys at  05/04/2011 09:14 AM  Elson, Gavin Pound, [id number]

## 2011-05-04 NOTE — Progress Notes (Signed)
Decreased insulin pump by 20% for one hour starting now 0839.

## 2011-05-04 NOTE — Progress Notes (Signed)
Patient did not experience any of the following events: a burn prior to discharge; a fall within the facility; wrong site/side/patient/procedure/implant event; or a hospital transfer or hospital admission upon discharge from the facility. (G8907) Patient did not have preoperative order for IV antibiotic SSI prophylaxis. (G8918)  

## 2011-05-05 ENCOUNTER — Telehealth: Payer: Self-pay | Admitting: *Deleted

## 2011-05-05 NOTE — Telephone Encounter (Signed)

## 2011-05-10 ENCOUNTER — Encounter: Payer: Self-pay | Admitting: Internal Medicine

## 2011-11-09 ENCOUNTER — Encounter (HOSPITAL_BASED_OUTPATIENT_CLINIC_OR_DEPARTMENT_OTHER): Payer: Self-pay | Admitting: *Deleted

## 2011-11-09 NOTE — Progress Notes (Signed)
Diabetic-insulin pump Here 10/12-ekg still good To come in for bmet

## 2011-11-10 ENCOUNTER — Other Ambulatory Visit: Payer: Self-pay | Admitting: Orthopedic Surgery

## 2011-11-14 ENCOUNTER — Encounter (HOSPITAL_BASED_OUTPATIENT_CLINIC_OR_DEPARTMENT_OTHER)
Admission: RE | Admit: 2011-11-14 | Discharge: 2011-11-14 | Disposition: A | Discharge status: Home / Self Care | Payer: BC Managed Care – PPO | Source: Ambulatory Visit | Attending: Orthopedic Surgery | Admitting: Orthopedic Surgery

## 2011-11-14 LAB — BASIC METABOLIC PANEL
CO2: 27 mEq/L (ref 19–32)
Chloride: 101 mEq/L (ref 96–112)
Creatinine, Ser: 0.67 mg/dL (ref 0.50–1.10)

## 2011-11-14 NOTE — Progress Notes (Signed)
Dr Jean Rosenthal given glucose results, clear for surgery, no orders given

## 2011-11-16 ENCOUNTER — Encounter (HOSPITAL_BASED_OUTPATIENT_CLINIC_OR_DEPARTMENT_OTHER): Payer: Self-pay | Admitting: Anesthesiology

## 2011-11-16 ENCOUNTER — Encounter (HOSPITAL_BASED_OUTPATIENT_CLINIC_OR_DEPARTMENT_OTHER)
Admission: RE | Disposition: A | Discharge status: Home / Self Care | Payer: Self-pay | Source: Ambulatory Visit | Attending: Orthopedic Surgery

## 2011-11-16 ENCOUNTER — Encounter (HOSPITAL_BASED_OUTPATIENT_CLINIC_OR_DEPARTMENT_OTHER): Payer: Self-pay | Admitting: *Deleted

## 2011-11-16 ENCOUNTER — Ambulatory Visit (HOSPITAL_BASED_OUTPATIENT_CLINIC_OR_DEPARTMENT_OTHER): Payer: BC Managed Care – PPO | Admitting: Anesthesiology

## 2011-11-16 ENCOUNTER — Ambulatory Visit (HOSPITAL_BASED_OUTPATIENT_CLINIC_OR_DEPARTMENT_OTHER)
Admission: RE | Admit: 2011-11-16 | Discharge: 2011-11-16 | Disposition: A | Discharge status: Home / Self Care | Payer: BC Managed Care – PPO | Source: Ambulatory Visit | Attending: Orthopedic Surgery | Admitting: Orthopedic Surgery

## 2011-11-16 DIAGNOSIS — F3289 Other specified depressive episodes: Secondary | ICD-10-CM | POA: Insufficient documentation

## 2011-11-16 DIAGNOSIS — E785 Hyperlipidemia, unspecified: Secondary | ICD-10-CM | POA: Insufficient documentation

## 2011-11-16 DIAGNOSIS — F329 Major depressive disorder, single episode, unspecified: Secondary | ICD-10-CM | POA: Insufficient documentation

## 2011-11-16 DIAGNOSIS — Z79899 Other long term (current) drug therapy: Secondary | ICD-10-CM | POA: Insufficient documentation

## 2011-11-16 DIAGNOSIS — M720 Palmar fascial fibromatosis [Dupuytren]: Secondary | ICD-10-CM | POA: Insufficient documentation

## 2011-11-16 DIAGNOSIS — M65849 Other synovitis and tenosynovitis, unspecified hand: Secondary | ICD-10-CM | POA: Insufficient documentation

## 2011-11-16 DIAGNOSIS — Z794 Long term (current) use of insulin: Secondary | ICD-10-CM | POA: Insufficient documentation

## 2011-11-16 DIAGNOSIS — M65839 Other synovitis and tenosynovitis, unspecified forearm: Secondary | ICD-10-CM | POA: Insufficient documentation

## 2011-11-16 DIAGNOSIS — E1069 Type 1 diabetes mellitus with other specified complication: Secondary | ICD-10-CM | POA: Insufficient documentation

## 2011-11-16 DIAGNOSIS — Z01812 Encounter for preprocedural laboratory examination: Secondary | ICD-10-CM | POA: Insufficient documentation

## 2011-11-16 DIAGNOSIS — M729 Fibroblastic disorder, unspecified: Secondary | ICD-10-CM | POA: Insufficient documentation

## 2011-11-16 DIAGNOSIS — Z7982 Long term (current) use of aspirin: Secondary | ICD-10-CM | POA: Insufficient documentation

## 2011-11-16 HISTORY — PX: TRIGGER FINGER RELEASE: SHX641

## 2011-11-16 HISTORY — PX: DUPUYTREN CONTRACTURE RELEASE: SHX1478

## 2011-11-16 LAB — GLUCOSE, CAPILLARY
Glucose-Capillary: 68 mg/dL — ABNORMAL LOW (ref 70–99)
Glucose-Capillary: 67 mg/dL — ABNORMAL LOW (ref 70–99)
Glucose-Capillary: 152 mg/dL — ABNORMAL HIGH (ref 70–99)

## 2011-11-16 LAB — POCT HEMOGLOBIN-HEMACUE: Hemoglobin: 12.6 g/dL (ref 12.0–15.0)

## 2011-11-16 SURGERY — RELEASE, A1 PULLEY, FOR TRIGGER FINGER
Anesthesia: General | Site: Hand | Laterality: Left | Wound class: Clean

## 2011-11-16 MED ORDER — EPHEDRINE SULFATE 50 MG/ML IJ SOLN
INTRAMUSCULAR | Status: DC | PRN
  Administered 2011-11-16: 10 mg via INTRAVENOUS

## 2011-11-16 MED ORDER — GLYCOPYRROLATE 0.2 MG/ML IJ SOLN
INTRAMUSCULAR | Status: DC | PRN
  Administered 2011-11-16: 0.2 mg via INTRAVENOUS

## 2011-11-16 MED ORDER — PROPOFOL 10 MG/ML IV EMUL
INTRAVENOUS | Status: DC | PRN
  Administered 2011-11-16: 200 mg via INTRAVENOUS

## 2011-11-16 MED ORDER — FENTANYL CITRATE 0.05 MG/ML IJ SOLN
INTRAMUSCULAR | Status: DC | PRN
  Administered 2011-11-16: 50 ug via INTRAVENOUS
  Administered 2011-11-16: 25 ug via INTRAVENOUS

## 2011-11-16 MED ORDER — OXYCODONE-ACETAMINOPHEN 5-325 MG PO TABS: ORAL_TABLET | ORAL | Status: AC

## 2011-11-16 MED ORDER — LACTATED RINGERS IV SOLN
INTRAVENOUS | Status: DC
  Administered 2011-11-16 (×2): via INTRAVENOUS

## 2011-11-16 MED ORDER — OXYCODONE HCL 5 MG PO TABS
5.0000 mg | ORAL_TABLET | Freq: Once | ORAL | Status: AC | PRN
  Administered 2011-11-16: 5 mg via ORAL

## 2011-11-16 MED ORDER — LIDOCAINE HCL (CARDIAC) 20 MG/ML IV SOLN
INTRAVENOUS | Status: DC | PRN
  Administered 2011-11-16: 40 mg via INTRAVENOUS

## 2011-11-16 MED ORDER — LIDOCAINE HCL 2 % IJ SOLN
INTRAMUSCULAR | Status: DC | PRN
  Administered 2011-11-16: 3 mL

## 2011-11-16 MED ORDER — OXYCODONE HCL 5 MG/5ML PO SOLN: 5.0000 mg | Freq: Once | ORAL | Status: AC | PRN

## 2011-11-16 MED ORDER — DEXAMETHASONE SODIUM PHOSPHATE 4 MG/ML IJ SOLN
INTRAMUSCULAR | Status: DC | PRN
  Administered 2011-11-16: 5 mg via INTRAVENOUS

## 2011-11-16 MED ORDER — PROPOFOL 10 MG/ML IV EMUL: INTRAVENOUS | Status: DC | PRN

## 2011-11-16 MED ORDER — METOCLOPRAMIDE HCL 5 MG/ML IJ SOLN
INTRAMUSCULAR | Status: DC | PRN
  Administered 2011-11-16: 10 mg via INTRAVENOUS

## 2011-11-16 MED ORDER — METOCLOPRAMIDE HCL 5 MG/ML IJ SOLN: 10.0000 mg | Freq: Once | INTRAMUSCULAR | Status: DC | PRN

## 2011-11-16 MED ORDER — HYDROMORPHONE HCL PF 1 MG/ML IJ SOLN: 0.2500 mg | INTRAMUSCULAR | Status: DC | PRN

## 2011-11-16 MED ORDER — CHLORHEXIDINE GLUCONATE 4 % EX LIQD: 60.0000 mL | Freq: Once | CUTANEOUS | Status: DC

## 2011-11-16 MED ORDER — ONDANSETRON HCL 4 MG/2ML IJ SOLN
INTRAMUSCULAR | Status: DC | PRN
  Administered 2011-11-16: 4 mg via INTRAVENOUS

## 2011-11-16 SURGICAL SUPPLY — 58 items
BANDAGE ADHESIVE 1X3 (GAUZE/BANDAGES/DRESSINGS) IMPLANT
BANDAGE CONFORM 3  STR LF (GAUZE/BANDAGES/DRESSINGS) IMPLANT
BANDAGE ELASTIC 3 VELCRO ST LF (GAUZE/BANDAGES/DRESSINGS) ×2 IMPLANT
BANDAGE GAUZE ELAST BULKY 4 IN (GAUZE/BANDAGES/DRESSINGS) ×4 IMPLANT
BLADE MINI RND TIP GREEN BEAV (BLADE) IMPLANT
BLADE SURG 15 STRL LF DISP TIS (BLADE) ×2 IMPLANT
BLADE SURG 15 STRL SS (BLADE) ×3
BNDG CMPR 9X4 STRL LF SNTH (GAUZE/BANDAGES/DRESSINGS) ×2
BNDG CMPR MD 5X2 ELC HKLP STRL (GAUZE/BANDAGES/DRESSINGS) ×2
BNDG ELASTIC 2 VLCR STRL LF (GAUZE/BANDAGES/DRESSINGS) ×3 IMPLANT
BNDG ESMARK 4X9 LF (GAUZE/BANDAGES/DRESSINGS) ×1 IMPLANT
BRUSH SCRUB EZ PLAIN DRY (MISCELLANEOUS) ×3 IMPLANT
CLOTH BEACON ORANGE TIMEOUT ST (SAFETY) ×3 IMPLANT
CORDS BIPOLAR (ELECTRODE) ×3 IMPLANT
COVER MAYO STAND STRL (DRAPES) ×3 IMPLANT
COVER TABLE BACK 60X90 (DRAPES) ×3 IMPLANT
CUFF TOURNIQUET SINGLE 18IN (TOURNIQUET CUFF) ×3 IMPLANT
DECANTER SPIKE VIAL GLASS SM (MISCELLANEOUS) IMPLANT
DRAPE EXTREMITY T 121X128X90 (DRAPE) ×3 IMPLANT
DRAPE SURG 17X23 STRL (DRAPES) ×3 IMPLANT
DRSG EMULSION OIL 3X3 NADH (GAUZE/BANDAGES/DRESSINGS) ×6 IMPLANT
GAUZE SPONGE 4X4 12PLY STRL LF (GAUZE/BANDAGES/DRESSINGS) ×4 IMPLANT
GAUZE XEROFORM 1X8 LF (GAUZE/BANDAGES/DRESSINGS) ×3 IMPLANT
GLOVE BIO SURGEON STRL SZ7 (GLOVE) ×2 IMPLANT
GLOVE BIOGEL M STRL SZ7.5 (GLOVE) ×2 IMPLANT
GLOVE ORTHO TXT STRL SZ7.5 (GLOVE) ×3 IMPLANT
GOWN PREVENTION PLUS XLARGE (GOWN DISPOSABLE) ×4 IMPLANT
GOWN PREVENTION PLUS XXLARGE (GOWN DISPOSABLE) ×5 IMPLANT
GOWN STRL REIN XL XLG (GOWN DISPOSABLE) ×4 IMPLANT
LOOP VESSEL MAXI BLUE (MISCELLANEOUS) ×2 IMPLANT
NDL HYPO 25X1 1.5 SAFETY (NEEDLE) IMPLANT
NEEDLE 27GAX1X1/2 (NEEDLE) ×1 IMPLANT
NEEDLE HYPO 25X1 1.5 SAFETY (NEEDLE) IMPLANT
NS IRRIG 1000ML POUR BTL (IV SOLUTION) ×3 IMPLANT
PACK BASIN DAY SURGERY FS (CUSTOM PROCEDURE TRAY) ×3 IMPLANT
PAD CAST 3X4 CTTN HI CHSV (CAST SUPPLIES) ×2 IMPLANT
PAD CAST 4YDX4 CTTN HI CHSV (CAST SUPPLIES) ×2 IMPLANT
PADDING CAST ABS 4INX4YD NS (CAST SUPPLIES)
PADDING CAST ABS COTTON 4X4 ST (CAST SUPPLIES) ×2 IMPLANT
PADDING CAST COTTON 3X4 STRL (CAST SUPPLIES) ×3
PADDING CAST COTTON 4X4 STRL (CAST SUPPLIES) ×3
SPLINT PLASTER CAST XFAST 3X15 (CAST SUPPLIES) ×8 IMPLANT
SPLINT PLASTER XTRA FASTSET 3X (CAST SUPPLIES)
SPONGE GAUZE 4X4 12PLY (GAUZE/BANDAGES/DRESSINGS) ×2 IMPLANT
STOCKINETTE 4X48 STRL (DRAPES) ×3 IMPLANT
STRIP CLOSURE SKIN 1/2X4 (GAUZE/BANDAGES/DRESSINGS) IMPLANT
SUT ETHILON 5 0 P 3 18 (SUTURE) ×2
SUT NYLON ETHILON 5-0 P-3 1X18 (SUTURE) ×4 IMPLANT
SUT PROLENE 4 0 P 3 18 (SUTURE) ×1 IMPLANT
SUT SILK 4 0 PS 2 (SUTURE) ×3 IMPLANT
SUT VIC AB 4-0 P2 18 (SUTURE) IMPLANT
SYR 3ML 23GX1 SAFETY (SYRINGE) IMPLANT
SYR BULB 3OZ (MISCELLANEOUS) IMPLANT
SYR CONTROL 10ML LL (SYRINGE) ×1 IMPLANT
TOWEL OR 17X24 6PK STRL BLUE (TOWEL DISPOSABLE) ×5 IMPLANT
TRAY DSU PREP LF (CUSTOM PROCEDURE TRAY) ×3 IMPLANT
UNDERPAD 30X30 INCONTINENT (UNDERPADS AND DIAPERS) ×3 IMPLANT
WATER STERILE IRR 1000ML POUR (IV SOLUTION) ×2 IMPLANT

## 2011-11-16 NOTE — Anesthesia Postprocedure Evaluation (Signed)
Anesthesia Post Note  Patient: Emily Combs [facility]  Procedure(s) Performed: Procedure(s) (LRB): RELEASE TRIGGER FINGER/A-1 PULLEY (Left) DUPUYTREN CONTRACTURE RELEASE (Left)  Anesthesia type: General  Patient location: PACU  Post pain: Pain level controlled  Post assessment: Patient's Cardiovascular Status Stable  Last Vitals:  Filed Vitals:   11/16/11 1415  BP: 123/59  Pulse: 106  Temp:   Resp: 15    Post vital signs: Reviewed and stable  Level of consciousness: alert  Complications: No apparent anesthesia complications

## 2011-11-16 NOTE — Transfer of Care (Signed)
Immediate Anesthesia Transfer of Care Note  Patient: Emily Combs [facility]  Procedure(s) Performed: Procedure(s) (LRB): RELEASE TRIGGER FINGER/A-1 PULLEY (Left) DUPUYTREN CONTRACTURE RELEASE (Left)  Patient Location: PACU  Anesthesia Type: General  Level of Consciousness: oriented and sedated  Airway & Oxygen Therapy: Patient Spontanous Breathing and Patient connected to face mask oxygen  Post-op Assessment: Report given to PACU RN and Post -op Vital signs reviewed and stable  Post vital signs: Reviewed and stable  Complications: No apparent anesthesia complications

## 2011-11-16 NOTE — Progress Notes (Signed)
Dr. Justin Mend notified of glucose of 68. Insulin pump off as ordered and D5W started at 75cc per hour as ordered

## 2011-11-16 NOTE — Anesthesia Procedure Notes (Signed)
Procedure Name: LMA Insertion Date/Time: 11/16/2011 12:23 PM Performed by: Gar Gibbon Pre-anesthesia Checklist: Patient identified, Emergency Drugs available, Suction available and Patient being monitored Patient Re-evaluated:Patient Re-evaluated prior to inductionOxygen Delivery Method: Circle System Utilized Preoxygenation: Pre-oxygenation with 100% oxygen Intubation Type: IV induction Ventilation: Mask ventilation without difficulty LMA: LMA inserted LMA Size: 4.0 Number of attempts: 1 Airway Equipment and Method: bite block Placement Confirmation: positive ETCO2 Tube secured with: Tape Dental Injury: Teeth and Oropharynx as per pre-operative assessment

## 2011-11-16 NOTE — Brief Op Note (Signed)
11/16/2011  1:10 PM  PATIENT:  Emily Combs  56 y.o. 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

## 2011-11-16 NOTE — Op Note (Signed)
[id number] 

## 2011-11-16 NOTE — H&P (Signed)
Emily Combs is an 56 y.o. female.   Chief Complaint: Palmar fibromatosis and multiple trigger fingers. HPI: 56 year old woman with history of chronic insulin-dependent diabetes and stenosing  tenosynovitis of her thumb long and small fingers of the left hand. On examination in the holding area there is clear triggering of the thumb and long finger however the small finger is not triggering at this time. Due to the fact that Emily Combs has insulin-dependent diabetes we have by mutual consent decided to also release the remaining small finger A1 pulley today because there is a high probability we will be back in the future.  Past Medical History  Diagnosis Date  . Cataract   . Diabetes mellitus     type I  on insulin pump  . Hyperlipidemia   . Adenomatous colon polyp   . Depression     Past Surgical History  Procedure Date  . Wrist surgery 01/2011     right 2012      [id number]  . Carpal tunnel release 2000    bilateral  . Hands     trigger finger release/ multiple  . Cesarean section 1989  . Appendectomy 2007    Family History  Problem Relation Age of Onset  . Colon cancer Paternal Grandmother    Social History:  reports that she has never smoked. She has never used smokeless tobacco. She reports that she drinks about .6 ounces of alcohol per week. She reports that she does not use illicit drugs.  Allergies: No Known Allergies  Medications Prior to Admission  Medication Sig Dispense Refill  . aspirin 81 MG tablet Take 162 mg by mouth daily.        Marland Kitchen Bioflavonoid Products (GRAPE SEED PO) Take 2 tablets by mouth daily. [place]       . Calcium Carbonate-Vitamin D (CALCIUM + D PO) Take 600 mg by mouth 2 (two) times daily.        . Multiple Vitamins-Minerals (MULTIVITAMIN WITH MINERALS) tablet Take 1 tablet by mouth daily.        . naproxen sodium (ANAPROX) 220 MG tablet Take 440 mg by mouth 2 (two) times daily with a meal.        . NOVOLOG 100 UNIT/ML injection Inject into  the skin as directed. Insulin pump      . PRISTIQ 50 MG 24 hr tablet Take 1 tablet by mouth Daily.      Marland Kitchen VYTORIN 10-20 MG per tablet Take 1 tablet by mouth Daily.        Results for orders placed during the hospital encounter of 11/16/11 (from the past 48 hour(s))  BASIC METABOLIC PANEL     Status: Abnormal   Collection Time   11/14/11  2:53 PM      Component Value Range Comment   Sodium 139  135 - 145 mEq/L    Potassium 3.7  3.5 - 5.1 mEq/L    Chloride 101  96 - 112 mEq/L    CO2 27  19 - 32 mEq/L    Glucose, Bld 242 (*) 70 - 99 mg/dL    BUN 12  6 - 23 mg/dL    Creatinine, Ser 5.40  0.50 - 1.10 mg/dL    Calcium 9.8  8.4 - 98.1 mg/dL    GFR calc non Af Amer >90  >90 mL/min    GFR calc Af Amer >90  >90 mL/min   POCT HEMOGLOBIN-HEMACUE     Status: Normal   Collection Time  11/16/11 10:34 AM      Component Value Range Comment   Hemoglobin 12.6  12.0 - 15.0 g/dL    No results found.  ROS  Blood pressure 114/76, pulse 82, temperature 98.5 F (36.9 C), temperature source Oral, resp. rate 18, SpO2 96.00%. Physical Exam  Constitutional: She is oriented to person, place, and time. She appears well-developed and well-nourished.  HENT:  Head: Normocephalic and atraumatic.  Eyes: Conjunctivae and EOM are normal.  Neck: Normal range of motion.  Cardiovascular: Normal rate and regular rhythm.   Respiratory: Effort normal and breath sounds normal.  GI: Soft.  Musculoskeletal: Normal range of motion.       Discomfort and triggering of the left thumb and long finger at A1 pulley consistent with chronic trigger finger. Well-healed scars overlying index and ring fingers from prior trigger finger releases. There is a chance cord of Dupuytren's palmar fibromatosis at the thumb index web space.  Neurological: She is alert and oriented to person, place, and time.  Skin: Skin is warm and dry.  Psychiatric: She has a normal mood and affect.     Assessment/Plan Chronic insulin-dependent  diabetes mellitus with history of carpal tunnel syndrome and multiple prior trigger finger episodes. 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 There is active triggering of the thumb and long fingers. Our plan is to proceed with release of the A1 pulleys of the left thumb left long finger and prophylactic release of the left small finger A1 pulley in an effort to avoid returning for trigger finger release in the future.   Questions regarding the anticipated surgery aftercare potential risks and benefits were explained in detail. Questions were invited and answered. The proper surgical site has been identified per protocol.  Helmut Hennon JR,Tashi Andujo V 11/16/2011, 11:55 AM

## 2011-11-16 NOTE — Anesthesia Preprocedure Evaluation (Signed)
Anesthesia Evaluation  Patient identified by MRN, date of birth, ID band Patient awake    Reviewed: Allergy & Precautions, H&P , NPO status , Patient's Chart, lab work & pertinent test results, reviewed documented beta blocker date and time   Airway Mallampati: II TM Distance: >3 FB Neck ROM: full    Dental   Pulmonary neg pulmonary ROS,  breath sounds clear to auscultation        Cardiovascular negative cardio ROS  Rhythm:regular     Neuro/Psych PSYCHIATRIC DISORDERS Depression negative neurological ROS  negative psych ROS   GI/Hepatic negative GI ROS, Neg liver ROS,   Endo/Other  negative endocrine ROSType 1, Insulin Dependent  Renal/GU negative Renal ROS  negative genitourinary   Musculoskeletal   Abdominal   Peds  Hematology negative hematology ROS (+)   Anesthesia Other Findings See surgeon's H&P   Reproductive/Obstetrics negative OB ROS                           Anesthesia Physical Anesthesia Plan  ASA: III  Anesthesia Plan: General   Post-op Pain Management:    Induction: Intravenous  Airway Management Planned: LMA  Additional Equipment:   Intra-op Plan:   Post-operative Plan: Extubation in OR  Informed Consent: I have reviewed the patients History and Physical, chart, labs and discussed the procedure including the risks, benefits and alternatives for the proposed anesthesia with the patient or authorized representative who has indicated his/her understanding and acceptance.   Dental Advisory Given  Plan Discussed with: CRNA and Surgeon  Anesthesia Plan Comments:         Anesthesia Quick Evaluation

## 2011-11-17 ENCOUNTER — Encounter (HOSPITAL_BASED_OUTPATIENT_CLINIC_OR_DEPARTMENT_OTHER): Payer: Self-pay | Admitting: Orthopedic Surgery

## 2011-11-17 NOTE — Op Note (Signed)
NAMECHESTINE, BELKNAP              ACCOUNT NO.:  [id number]  MEDICAL RECORD NO.:  [id number]  LOCATION:                                 FACILITY:  PHYSICIAN:  Katy Fitch. Jayquon Theiler, M.D.      DATE OF BIRTH:  DATE OF PROCEDURE:  11/16/2011 DATE OF DISCHARGE:                              OPERATIVE REPORT   PREOPERATIVE DIAGNOSES: 1. Chronic insulin-dependent diabetes with palmar fibromatosis and     thick natatory ligament cord, thumb-index web space, left hand. 2. Chronic stenosing tenosynovitis, left thumb. 3. Chronic stenosing tenosynovitis, left long finger. 4. Prophylactic release of left small finger A1 pulley due to mutual     agreement to avoid return to operating room.  POSTOPERATIVE DIAGNOSES: 1. Chronic insulin-dependent diabetes with palmar fibromatosis and     thick natatory ligament cord, thumb-index web space, left hand. 2. Chronic stenosing tenosynovitis, left thumb. 3. Chronic stenosing tenosynovitis, left long finger. 4. Prophylactic release of left small finger A1 pulley due to mutual     agreement to avoid return to operating room.  OPERATION: 1. Needle aponeurotomy, release of first web natatory ligament,     Dupuytren's cord. 2. Release of left thumb A1 pulley. 3. Release of left long finger A1 pulley. 4. Release of left small finger A1 pulley.  OPERATING SURGEON:  Katy Fitch. Efraim Vanallen, MD  ASSISTANT:  Nurse.  ANESTHESIA:  General by LMA.  SUPERVISING ANESTHESIOLOGIST:  Janetta Hora. Gelene Mink, MD  INDICATIONS:  Emily Combs is a 56 year old woman referred by Dr. Ardyth Harps for evaluation and management of chronic Dupuytren's palmar fibromatosis.  Ms. Livengood has a history of multiple trigger fingers and carpal tunnel syndrome, treated in the past.  She now presented with active triggering of her left thumb, left long finger and had only one remaining finger, i.e., the small finger that had not been released.  She was developing palmar fibromatosis  associated with noninsulin-dependent diabetes as well as probable Dupuytren's with a thick natatory ligament cord in the thumb index webspace.  We advised her to proceed with release of the A1 pulley of the thumb, long finger, and by mutual agreement, she requested that we release the small finger so she would not have to return to the operating room in the future.  In addition, we advised that we could relieve the tension in her natatory ligament contracture without an incision by careful needle aponeurotomy.  After informed consent, she was brought to the operating room at this time.  PROCEDURE:  Emily Combs was brought to room #1 of the [facility] and placed in supine position on the operating table.  After anesthesia consultation by Dr. Gelene Mink, general anesthesia by LMA technique was recommended and accepted.  In room #2 under Dr. Thornton Dales direct supervision, general anesthesia by LMA technique was induced.  The left hand and arm were prepped with Betadine soap and solution, sterilely draped.  A pneumatic tourniquet was applied to the proximal left brachium.  Following exsanguination of the left arm with Esmarch bandage, the arterial tourniquet was inflated to 220 mmHg and due to mild breakthrough bleeding with some episodes of movement with light anesthesia, we subsequently re-exsanguinated the limb  and inflated the arterial tourniquet to 240 mmHg.  After anesthesia was noted to be adequate, we proceeded with a routine surgical time-out.  Procedure commenced with a short transverse incision directly over the A1 pulley of the thumb.  Subcutaneous tissues were carefully divided taking care to release Dupuytren's fibromatosis followed by identification and retraction of the radial proper digital nerve.  The A1 pulley was isolated and split with scalpel and scissors. The flexor pollicis longus was delivered and found to have some pressure necrosis and  otherwise, no significant issues.  The wound was repaired with intradermal 3-0 Prolene.  Attention was then directed to the long finger where a short oblique incision was fashioned in the distal palmar crease directly over the palpably thickened A1 pulley.  There were Dupuytren's nodules present, which were excised.  The A1 pulley was isolated, split with scalpel and scissors.  There was a small A0 pulley present that was released.  The tendons were delivered and found to be otherwise normal.  Attention was then directed to the small finger where short oblique incision was fashioned over the palpably evident A1 pulley.  The pulley was inspected and found to have relatively normal morphology.  This was split with scalpel and scissors prophylactically.  Attention was then directed to the thumb index webspace.  The thumb was abducted in that position of midradial and palmar abduction.  The cord was quite evident.  With great care, a 21-gauge needle was threaded beneath the skin surface and used with a gentle windshield wiper motion pressing the cord against the needle tip.  Aponeurotomy was performed at three levels releasing tension on the cord.  There were no problems with bleeding or other consequence from the aponeurotomy.  The thumb, long and small finger incisions were repaired with intradermal 3-0 Prolene.  Steri-Strips were applied followed by dressing the hand with sterile gauze, sterile Kling, sterile Webril, and Ace wrap dressing.  The tourniquet was released with immediate capillary refill to the fingers and thumb.  There were no apparent complications.     Katy Fitch Cantrell Martus, M.D.     RVS/MEDQ  D:  11/16/2011  T:  11/17/2011  Job:  [id number]  cc:   Jeannett Senior A. [name], M.D.

## 2012-09-10 ENCOUNTER — Encounter: Payer: Self-pay | Admitting: Neurology

## 2012-09-16 ENCOUNTER — Encounter: Payer: Self-pay | Admitting: Neurology

## 2012-09-16 ENCOUNTER — Ambulatory Visit (INDEPENDENT_AMBULATORY_CARE_PROVIDER_SITE_OTHER): Payer: BC Managed Care – PPO | Admitting: Neurology

## 2012-09-16 VITALS — BP 127/71 | HR 80 | Temp 96.7°F | Ht 62.75 in | Wt 162.0 lb

## 2012-09-16 DIAGNOSIS — G4733 Obstructive sleep apnea (adult) (pediatric): Secondary | ICD-10-CM | POA: Insufficient documentation

## 2012-09-16 DIAGNOSIS — E108 Type 1 diabetes mellitus with unspecified complications: Secondary | ICD-10-CM

## 2012-09-16 NOTE — Patient Instructions (Signed)
Add a 3 cm flex or EPR function to the CPAP at 10 cm. RAMP starts at 4 cm. Downloaded  09-16-12 . Sleep Apnea  Sleep apnea is a sleep disorder characterized by abnormal pauses in breathing while you sleep. When your breathing pauses, the level of oxygen in your blood decreases. This causes you to move out of deep sleep and into light sleep. As a result, your quality of sleep is poor, and the system that carries your blood throughout your body (cardiovascular system) experiences stress. If sleep apnea remains untreated, the following conditions can develop:  High blood pressure (hypertension).  Coronary artery disease.  Inability to achieve or maintain an erection (impotence).  Impairment of your thought process (cognitive dysfunction). There are three types of sleep apnea: 1. Obstructive sleep apnea Pauses in breathing during sleep because of a blocked airway. 2. Central sleep apnea Pauses in breathing during sleep because the area of the brain that controls your breathing does not send the correct signals to the muscles that control breathing. 3. Mixed sleep apnea A combination of both obstructive and central sleep apnea. RISK FACTORS The following risk factors can increase your risk of developing sleep apnea:  Being overweight.  Smoking.  Having narrow passages in your nose and throat.  Being of older age.  Being female.  Alcohol use.  Sedative and tranquilizer use.  Ethnicity. Among individuals younger than 35 years, African Americans are at increased risk of sleep apnea. SYMPTOMS   Difficulty staying asleep.  Daytime sleepiness and fatigue.  Loss of energy.  Irritability.  Loud, heavy snoring.  Morning headaches.  Trouble concentrating.  Forgetfulness.  Decreased interest in sex. DIAGNOSIS  In order to diagnose sleep apnea, your caregiver will perform a physical examination. Your caregiver may suggest that you take a home sleep test. Your caregiver may also  recommend that you spend the night in a sleep lab. In the sleep lab, several monitors record information about your heart, lungs, and brain while you sleep. Your leg and arm movements and blood oxygen level are also recorded. TREATMENT The following actions may help to resolve mild sleep apnea:  Sleeping on your side.   Using a decongestant if you have nasal congestion.   Avoiding the use of depressants, including alcohol, sedatives, and narcotics.   Losing weight and modifying your diet if you are overweight. There also are devices and treatments to help open your airway:  Oral appliances. These are custom-made mouthpieces that shift your lower jaw forward and slightly open your bite. This opens your airway.  Devices that create positive airway pressure. This positive pressure "splints" your airway open to help you breathe better during sleep. The following devices create positive airway pressure:  Continuous positive airway pressure (CPAP) device. The CPAP device creates a continuous level of air pressure with an air pump. The air is delivered to your airway through a mask while you sleep. This continuous pressure keeps your airway open.  Nasal expiratory positive airway pressure (EPAP) device. The EPAP device creates positive air pressure as you exhale. The device consists of single-use valves, which are inserted into each nostril and held in place by adhesive. The valves create very little resistance when you inhale but create much more resistance when you exhale. That increased resistance creates the positive airway pressure. This positive pressure while you exhale keeps your airway open, making it easier to breath when you inhale again.  Bilevel positive airway pressure (BPAP) device. The BPAP device is used mainly  in patients with central sleep apnea. This device is similar to the CPAP device because it also uses an air pump to deliver continuous air pressure through a mask. However,  with the BPAP machine, the pressure is set at two different levels. The pressure when you exhale is lower than the pressure when you inhale.  Surgery. Typically, surgery is only done if you cannot comply with less invasive treatments or if the less invasive treatments do not improve your condition. Surgery involves removing excess tissue in your airway to create a wider passage way. Document Released: 04/07/2002 Document Revised: 10/17/2011 Document Reviewed: 08/24/2011 [facility] Patient Information 2013 [place], [place]. CPAP and BIPAP CPAP and BIPAP are methods of helping you breathe. CPAP stands for "continuous positive airway pressure." BIPAP stands for "bi-level positive airway pressure." Both CPAP and BIPAP are provided by a small machine with a flexible plastic tube that attaches to a plastic mask that goes over your nose or mouth. Air is blown into your air passages through your nose or mouth. This helps to keep your airways open and helps to keep you breathing well. The amount of pressure that is used to blow the air into your air passages can be set on the machine. The pressure setting is based on your needs. With CPAP, the amount of pressure stays the same while you breathe in and out. With BIPAP, the amount of pressure changes when you inhale and exhale. Your caregiver will recommend whether CPAP or BIPAP would be more helpful for you.  CPAP and BIPAP can be helpful for both adults and children with:  Sleep apnea.  Chronic Obstructive Pulmonary Disease (COPD), a condition like emphysema.  Diseases which weaken the muscles of the chest such as muscular dystrophy or neurological diseases.  Other problems that cause breathing to be weak or difficult. USE OF CPAP OR BIPAP The respiratory therapist or technician will help you get used to wearing the mask. Some people feel claustrophobic (a trapped or closed in feeling) at first, because the mask needs to be fairly snug on your face.   It may  help you to get used to the mask gradually, by first holding the mask loosely over your nose or mouth using a low pressure setting on the machine. Gradually the mask can be applied more snugly with increased pressure. You can also gradually increase the amount of time the mask is used.  People with sleep apnea will use the mask and machine at night when they are sleeping. Others, like those with ALS or other breathing difficulties, may need the CPAP or BIPAP all the time.  If the first mask you try does not fit well, or is uncomfortable, there are other types and sizes that can be tried.  If you tend to breathe through your mouth, a chin strap may be applied to help keep your mouth closed (if you are using a nasal mask).  The CPAP and BIPAP machines have alarms that may sound if the mask comes off or develops a leak.  You should not eat or drink while the CPAP or BIPAP is on. Food or fluids could get pushed into your lungs by the pressure of the CPAP or BIPAP. Sometimes CPAP or BIPAP machines are ordered for home use. If you are going to use the CPAP or BIPAP machine at home, follow these instructions  CPAP or BIPAP machines can be rented or purchased through home health care companies. There are many different brands of machines available.  If you rent a machine before purchasing you may find which particular machine works well for you.  Ask questions if there is something you do not understand when picking out your machine.  Place your CPAP or BIPAP machine on a secure table or stand near an electrical outlet.  Know where the On/Off switch is.  Follow your doctor's instructions for how to set the pressure on your machine and when you should use it.  Do not smoke! Tobacco smoke residue can damage the machine. SEEK IMMEDIATE MEDICAL CARE IF:   You have redness or open areas around your nose or mouth.  You have trouble operating the CPAP or BIPAP machine.  You cannot tolerate wearing the  CPAP or BIPAP mask.  You have any questions or concerns. Document Released: 01/14/2004 Document Revised: 07/10/2011 Document Reviewed: 04/14/2008 [facility] Patient Information 2013 [place], [place].  Calorie Diabetic Diet The 1200 calorie diabetic diet limits calories to 1200 each day. Following this diet and making healthy meal choices can help improve overall health. It controls blood glucose (sugar) levels and can also help lower blood pressure and cholesterol.  SERVING SIZES Measuring foods and serving sizes helps to make sure you are getting the right amount of food. The list below tells how big or small some common serving sizes are.   1 oz.........4 stacked dice.  3 oz........Marland KitchenDeck of cards.  1 tsp.......Marland KitchenTip of little finger.  1 tbs......Marland KitchenMarland KitchenThumb.  2 tbs.......Marland KitchenGolf ball.   cup......Marland KitchenHalf of a fist.  1 cup.......Marland KitchenA fist. GUIDELINES FOR CHOOSING FOODS The goal of this diet is to eat a variety of foods and limit calories to 1200 each day. This can be done by choosing foods that are low in calories and fat. The diet also suggests eating small amounts of food frequently. Doing this helps control your blood glucose levels, so they do not get too high or too low. Each meal or snack may include a protein food source to help you feel more satisfied. Try to eat about the same amount of food around the same time each day. This includes weekend days, travel days, and days off work. Space your meals about 4 to 5 hours apart, and add a snack between them, if you wish.  For example, a daily food plan could include breakfast, a morning snack, lunch, dinner, and an evening snack. Healthy meals and snacks have different types of foods, including whole grains, vegetables, fruits, lean meats, poultry, fish, and dairy products. As you plan your meals, select a variety of foods. Choose from the bread and starch, vegetable, fruit, dairy, and meat/protein groups. Examples of foods from each group are listed  below, with their suggested serving sizes. Use measuring cups and spoons to become familiar with what a healthy portion looks like. Bread and Starch Each serving equals 15 grams of carbohydrate.  1 slice bread.   bagel.   cup cold cereal (unsweetened).   cup hot cereal or mashed potatoes.  1 small potato (size of a computer mouse).   cup cooked pasta or rice.   English muffin.  1 cup broth-based soup.  3 cups of popcorn.  4 to 6 whole-wheat crackers.   cup cooked beans, peas, or corn. Vegetables Each serving equals 5 grams of carbohydrate.   cup cooked vegetables.  1 cup raw vegetables.   cup tomato or vegetable juice. Fruit Each serving equals 15 grams of carbohydrate.  1 small apple or orange.  1  cup watermelon or strawberries.   cup applesauce (  no sugar added).  2 tbs raisins.   banana.   cup canned fruit, packed in water or in its own juice.   cup unsweetened fruit juice. Dairy Each serving equals 12 to 15 grams of carbohydrate.  1 cup fat-free milk.  6 oz artificially sweetened yogurt or plain yogurt.  1 cup low-fat buttermilk.  1 cup soy milk.  1 cup almond milk. Meat/Protein  1 large egg.  2 to 3 oz meat, poultry, or fish.   cup low-fat cottage cheese.  1 tbs peanut butter.  1 oz low-fat cheese.   cup tuna, packed in water.   cup tofu. Fat  1 tsp oil.  1 tsp trans-fat-free margarine.  1 tsp butter.  1 tsp mayonnaise.  2 tbs avocado.  1 tbs salad dressing.  1 tbs cream cheese.  2 tbs sour cream. SAMPLE 1200 CALORIE DIET PLAN Breakfast  1 cup fat-free milk (1 carb serving).  1 small orange (1 carb serving).  1 scrambled egg.  1 slice whole-wheat toast (1 carb serving). Lunch  Sandwich  2 slices whole-wheat bread (2 carb servings).  2 oz lean meat.  2 tsp reduced fat mayonnaise.  1 lettuce leaf.  2 slices tomato.  1 cup carrot sticks. Afternoon Snack  1 small apple (1 carb  serving).  1 string cheese. Dinner  2 oz meat.  1 small baked potato (1 carb serving).  1 tsp trans-fat-free margarine.  1 cup steamed broccoli.  1 cup fat-free milk (1 carb serving). Evening Snack   small banana (1 carb serving).  6 vanilla wafers (1 carb serving). MEAL PLAN You can use this worksheet to help you make a daily meal plan based on the 1200 calorie diabetic diet suggestions. If you are using this plan to help you control your blood glucose, you may interchange carbohydrate containing foods (dairy, starches, and fruits). Select a variety of fresh foods of varying colors and flavors. The total amount of carbohydrate in your meals or snacks is more important than making sure you include all of the food groups every time you eat. You can choose from approximately this many of the following foods to build your day's meals:  6 Starches.  3 Vegetables.  2 Fruits.  2 Dairy.  4 to 6 oz Meat/Protein.  Up to 3 Fats. Your dietician can use this worksheet to help you decide how many servings and which types of foods are right for you. BREAKFAST Food Group and Servings / Food Choice Starch _________________________________________________________ Dairy __________________________________________________________ Fruit ___________________________________________________________ Meat/Protein____________________________________________________ Fat ____________________________________________________________ LUNCH Food Group and Servings / Food Choice  Starch _________________________________________________________ Meat/Protein ___________________________________________________ Vegetables _____________________________________________________ Fruit __________________________________________________________ Dairy __________________________________________________________ Fat ____________________________________________________________ Aura Fey Food Group and  Servings / Food Choice Dairy __________________________________________________________ Fruit ___________________________________________________________ Starch __________________________________________________________ Meat/Protein____________________________________________________ Laural Golden Food Group and Servings / Food Choice Starch _________________________________________________________ Meat/Protein ___________________________________________________ Dairy __________________________________________________________ Vegetables _____________________________________________________ Fruit __________________________________________________________ Fat ____________________________________________________________ Lollie Sails Food Group and Servings / Food Choice Fruit ___________________________________________________________ Meat/Protein ____________________________________________________ Dairy __________________________________________________________ Starch __________________________________________________________ DAILY TOTALS Starches _________________________ Vegetables _______________________ Fruits ____________________________ Dairy ____________________________ Meat/Protein______________________ Fats _____________________________ Document Released: 11/07/2004 Document Revised: 07/10/2011 Document Reviewed: 03/04/2009 [employer] Patient Information 2013 [place], [place].

## 2012-09-16 NOTE — Progress Notes (Signed)
[facility]  Provider:  Dr Esten Dollar Referring Provider: Julian Hy, MD Primary Care Physician:  Julian Hy, MD  No chief complaint on file.   HPI:  Emily Combs is a 57 y.o. female here as a referral from Dr. [name] for a sleep consult.  This patient has a 47 year history of insulin-dependent diabetic mellitus type I, diagnosed at age 44. The patient is on insulin pump and caries C. additional diagnoses of hyperlipidemia, and a suspicion of diabetic gastroparesis, nephritis, but she denies neck neuropathy. She presented with a chief complaint of fatigue to her primary care physician and endocrinologist Dr. Piedad Climes. She also had reported that she snores, but first as was reported by her son. She does have excessive daytime sleepiness based on an test sleepiness score at 15 points. The referring physician hawked that if this patient presents this apnea and apnea can be treated but it also would benefit the patient's  glucose levels positively. She had not been told that she has apnea, she has no history of shift work or night shift work.  He reported to Dr. [name]  that her sleep was longer restorative she would break up in the morning, but felt exhausted. She also endorsed 12 points on today's sleepiness score. She denies nocturia. Patient has a history of controlled diabetes but states that recently her blood glucose once  being on CPAP (and was actually elevated). Patient is menopausal .  She underwent a adult split night polysomnography on 06-21-12 the first half of the night with a sleep time of 160 minutes revealed an AHI of 13.8 and the RDI of 17.9.:   22 of the  apneas occurred in REM sleep and 15 in non-REM sleep, this raised the REM specific AHI to 62.9.  the patient slept all night in supine position. Louis oxygen saturation was 71% but only for 6.7 minutes. The patient was titrated to 10 cm water CPAP and the AHI at the setting was 0.0.  Her oxygen  nadir rose to  of 90% the sleep study showed that she initially was unable to go into REM sleep and is may indicate that her body had adjusted to the high REM sleep dependant AHI.   the patient considers herself a deep sleeper, does not have a primary insomnia problems. At bedtime is between 11 and 12 PM, usually falling asleep within 10-15 minutes and rising at 7:30-8 AM. She needs an alarm to wake up.  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 She noted that she is sleeping better on CPAP now set at 10 cm water she does use a Resmed nasal pillow FX for her.           Review of Systems: Out of a complete 14 system review, the patient complains of only the following symptoms, and all other reviewed systems are negative.   History   Social History  . Marital Status: Legally Separated    Spouse Name: N/A    Number of Children: N/A  . Years of Education: N/A   Occupational History  . Not on file.   Social History Main Topics  . Smoking status: Never Smoker   . Smokeless tobacco: Never Used  . Alcohol Use: 0.6 oz/week    1 Glasses of wine per week     Comment: rarely wine  . Drug Use: No  . Sexually Active: Not on file   Other Topics Concern  . Not  on file   Social History Narrative  . No narrative on file    Family History  Problem Relation Age of Onset  . Colon cancer Paternal Grandmother     Past Medical History  Diagnosis Date  . Cataract   . Diabetes mellitus     type I  on insulin pump  . Hyperlipidemia   . Adenomatous colon polyp   . Depression   . Sleep apnea, obstructive      with AHI of 13.8 , RDI of 17.9 -SPLIT study at [facility]  06-21-12    Past Surgical History  Procedure Laterality Date  . Wrist surgery  01/2011     right 2012      [id number]  . Carpal tunnel release  2000    bilateral  . Hands      trigger finger release/ multiple  . Cesarean section  1989  . Appendectomy  2007  . Trigger finger release  11/16/2011     Procedure: RELEASE TRIGGER FINGER/A-1 PULLEY;  Surgeon: Wyn Forster., MD;  Location: [facility];  Service: Orthopedics;  Laterality: Left;  Left hand dupuytrens and A-1 pulley release left thumb,left long and small fingers.  . Dupuytren contracture release  11/16/2011    Procedure: DUPUYTREN CONTRACTURE RELEASE;  Surgeon: Wyn Forster., MD;  Location: [facility];  Service: Orthopedics;  Laterality: Left;    Current Outpatient Prescriptions  Medication Sig Dispense Refill  . aspirin 81 MG tablet Take 162 mg by mouth daily.        Marland Kitchen Bioflavonoid Products (GRAPE SEED PO) Take 2 tablets by mouth daily. [place]       . Calcium Carbonate-Vitamin D (CALCIUM + D PO) Take 600 mg by mouth 2 (two) times daily.        . Multiple Vitamins-Minerals (MULTIVITAMIN WITH MINERALS) tablet Take 1 tablet by mouth daily.        Marland Kitchen NOVOLOG 100 UNIT/ML injection Inject into the skin as directed. Insulin pump      . PRISTIQ 50 MG 24 hr tablet Take 1 tablet by mouth Daily.      Marland Kitchen VYTORIN 10-20 MG per tablet Take 1 tablet by mouth Daily.      . naproxen sodium (ANAPROX) 220 MG tablet Take 440 mg by mouth 2 (two) times daily with a meal.         No current facility-administered medications for this visit.    Allergies as of 09/16/2012  . (No Known Allergies)    Vitals: There were no vitals taken for this visit. Last Weight:  Wt Readings from Last 1 Encounters:  05/04/11 168 lb (76.204 kg)   Last Height:   Ht Readings from Last 1 Encounters:  05/04/11 5\' 3"  (1.6 m)   Vision Screening:  Vitals   Physical exam:  General: The patient is awake, alert and appears not in acute distress. The patient is well groomed. Head: Normocephalic, atraumatic. Neck is supple. Mallampati 3  neck circumference: Cardiovascular:  Regular rate and rhythm, without  murmurs or carotid bruit, and without distended neck veins.she has a short and thick neck and  open nasal airflow.    Respiratory: Lungs are clear to auscultation. Skin:  Without evidence of rash,  patient has slight facial puffiness.  Trunk: BMI is 30.8,  elevated and patient  has normal posture.  Neurologic exam : The patient is awake and alert, oriented to place and time.  Memory subjective  described as intact.  There is a normal attention span & concentration ability. Speech is fluent without  dysarthria, dysphonia or aphasia. Mood and affect are appropriate.  Cranial nerves: Pupils are equal and briskly reactive to light. Funduscopic exam without  evidence of pallor or edema. Status post bilat cataract surgery, mild retinopathy.  Extraocular movements  in vertical and horizontal planes intact and without nystagmus. Visual fields by finger perimetry are intact. Hearing to finger rub intact.  Facial sensation intact to fine touch. Facial motor strength is symmetric and tongue and uvula move midline.  Motor exam:   Normal tone and normal muscle bulk and symmetric normal strength in all extremities.  Sensory:  Fine touch, pinprick and vibration were tested in all extremities- in this diabetic patient. Proprioception is tested in the upper extremities only. This was  normal.  Coordination: Rapid alternating movements in the fingers/hands is tested and normal. Finger-to-nose maneuver tested and normal without evidence of ataxia, dysmetria or tremor.  Gait and station: Patient walks without assistive device and is able and assisted stool climb up to the exam table. Strength within normal limits. Stance is stable and normal.  Tandem gait is intact  turns with 3 Steps,which  are unfragmented. Romberg testing is normal.  Deep tendon reflexes: in the  upper and lower extremities are symmetric and intact. They are unexpectatly brisk - Babinski maneuver response is  downgoing.   Assessment:  After physical and neurologic examination, review of laboratory studies, imaging, neurophysiology testing and pre-existing  records, assessment will be reviewed on the problem list.  Plan:  Treatment plan and additional workup will be reviewed under Problem List.   Mild obstructive sleep apnea, by office of 10 download very well treated at 10 cm water pressure. The patient also has excellent compliance of 100% in her 30 day download. Additional and sustainable treatment option for this patient include weight loss, by diet and exercise. Dental device would not likely work for this patient, as she has neither retrognathia, or bruxism.  Her oxygen nadir during her sleep study was concerning that she did not desaturate for a prolonged period of time. I will add a short  Exercise / diet and exercise for weight loss to my patient instructions today.

## 2012-09-16 NOTE — Assessment & Plan Note (Signed)
Download in office reveals very good resolution of the average AHI to 3.7 at 10 cm pressure.

## 2012-09-21 ENCOUNTER — Other Ambulatory Visit: Payer: Self-pay | Admitting: Pharmacotherapy

## 2013-01-01 ENCOUNTER — Encounter: Payer: Self-pay | Admitting: Neurology

## 2013-07-03 ENCOUNTER — Encounter: Payer: Self-pay | Admitting: Neurology

## 2013-07-11 ENCOUNTER — Encounter: Payer: Self-pay | Admitting: Neurology

## 2013-07-14 ENCOUNTER — Encounter: Payer: Self-pay | Admitting: Neurology

## 2013-09-17 ENCOUNTER — Ambulatory Visit (INDEPENDENT_AMBULATORY_CARE_PROVIDER_SITE_OTHER): Payer: BC Managed Care – PPO | Admitting: Neurology

## 2013-09-17 ENCOUNTER — Encounter (INDEPENDENT_AMBULATORY_CARE_PROVIDER_SITE_OTHER): Payer: Self-pay

## 2013-09-17 ENCOUNTER — Encounter: Payer: Self-pay | Admitting: Neurology

## 2013-09-17 VITALS — BP 128/76 | HR 96 | Resp 17 | Ht 63.5 in | Wt 174.0 lb

## 2013-09-17 DIAGNOSIS — Z9989 Dependence on other enabling machines and devices: Principal | ICD-10-CM

## 2013-09-17 DIAGNOSIS — G4733 Obstructive sleep apnea (adult) (pediatric): Secondary | ICD-10-CM

## 2013-09-17 NOTE — Progress Notes (Signed)
[facility]  Provider:  Dr Elana Jian Referring Provider: Sheela Stack, MD Primary Care Physician:  Sheela Stack, MD  Chief Complaint  Patient presents with  . Follow-up    Room 10  . Sleep Apnea    HPI:    Emily Combs is a 58 y.o. female here as a revisit from Dr. Forde Dandy for a sleep consult. Emily Combs is a Caucasian, right handed female with a 55 year history of insulin-dependent diabetic mellitus type I, diagnosed at age 87. She started CPAP use last year . She is followed by [facility], Richardean Canal.  Her residual AHI is 2.00 - AHI during a sleep study was 17.8 per hour of sleep she is using a CPAP of 5 hours 40 minutes each night and heard compliance is 85% for the last 90 days this means that the patient has uses CPAP every day but not every day for 4 hours. The download was obtained on 07-02-13 at the height of tax season.  CPAP is set at 10 cm water ,   The patient states that she still could take a nap of several hours each afternoon if she would find the time to do so.  Sometimes she will break up after the nap ,will go to sleep again at 10 PM , falls asleep promptly . she usually sleeps now better through the night. Her chief complaint is forgetfullness, rather than sleepiness.      Last visit note ;   The patient is on an insulin pump and carries the  additional diagnoses of hyperlipidemia, and a suspicion of diabetic gastroparesis, nephritis, but she denies neck neuropathy. She presented with a chief complaint of fatigue to her primary care physician and endocrinologist Dr. Ulyses Jarred. She also had reported that she snores, but first as was reported by her son.  She had not been told that she has apnea, she has no history of shift work or night shift work. He reported to Dr. Forde Dandy  that her sleep was longer restorative she would break up in the morning, but felt exhausted. She also endorsed 12 points on today's sleepiness score. She denies  nocturia. Patient has a history of controlled diabetes but states that recently her blood glucose once  being on CPAP (and was actually elevated). Patient is menopausal . She underwent a adult split night polysomnography on 06-21-12 the first half of the night with a sleep time of 160 minutes revealed an AHI of 13.8 and the RDI of 17.9.:   22 of the  apneas occurred in REM sleep and 15 in non-REM sleep, this raised the REM specific AHI to 62.9.  the patient slept all night in supine position. Lowest  oxygen saturation was 71% but only for 6.7 minutes.  The patient was titrated to 10 cm water CPAP and the AHI at the setting was 0.0.  Her oxygen nadir rose to  90%  On CPAP- the sleep study showed that she initially was unable to go into REM sleep and is may indicate that her body had adjusted to the high REM sleep dependant AHI.  The patient considers herself " a deep sleeper"  does not have a primary insomnia problems.  At bedtime is between 11 and 12 PM, usually falling asleep within 10-15 minutes and rising at 7:30-8 AM. She needs an alarm to wake up. 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 She  exposes herself to daylight while exercising.    The patient is a Engineer, maintenance (IT) and just had  a very stressful tax season march / April .  She noted  Again that she is sleeping better on CPAP now set at 10 cm water she does use a Resmed nasal pillow FX for her.     Review of Systems: Out of a complete 14 system review, the patient complains of only the following symptoms, and all other reviewed systems are negative. Epworth sleepiness score was endorsed at 11 points and her fatigue severity score at 39 points.   History   Social History  . Marital Status: Legally Separated    Spouse Name: N/A    Number of Children: 1  . Years of Education: BS   Occupational History  .     Social History Main Topics  . Smoking status: Never Smoker   . Smokeless  tobacco: Never Used  . Alcohol Use: 0.6 oz/week    1 Glasses of wine per week     Comment: occasionally  . Drug Use: No  . Sexual Activity: Not on file   Other Topics Concern  . Not on file   Social History Narrative   Patient is divorced and lives at home with her son.   Patient is working full-time.   Patient has a BS degree.   Patient is right-handed.   Patient drinks three cups of coffee and occasionally tea.    Family History  Problem Relation Age of Onset  . Colon cancer Paternal Grandmother   . Tuberculosis Maternal Aunt   . Tuberculosis Maternal Grandmother     Past Medical History  Diagnosis Date  . Cataract   . Diabetes mellitus     type I  on insulin pump  . Hyperlipidemia   . Adenomatous colon polyp   . Depression   . Sleep apnea, obstructive      with AHI of 13.8 , RDI of 17.9 -SPLIT study at [facility]  06-21-12    Past Surgical History  Procedure Laterality Date  . Wrist surgery  01/2011     right 2012      [id number]  . Carpal tunnel release  2000    bilateral  . Hands      trigger finger release/ multiple  . Cesarean section  1989  . Appendectomy  2007  . Trigger finger release  11/16/2011    Procedure: RELEASE TRIGGER FINGER/A-1 PULLEY;  Surgeon: Cammie Sickle., MD;  Location: [place];  Service: Orthopedics;  Laterality: Left;  Left hand dupuytrens and A-1 pulley release left thumb,left long and small fingers.  . Dupuytren contracture release  11/16/2011    Procedure: DUPUYTREN CONTRACTURE RELEASE;  Surgeon: Cammie Sickle., MD;  Location: [place];  Service: Orthopedics;  Laterality: Left;  . Cataract extraction  2013    Current Outpatient Prescriptions  Medication Sig Dispense Refill  . aspirin 81 MG tablet Take 162 mg by mouth daily.        . benzonatate (TESSALON) 100 MG capsule as needed.      . Calcium Carbonate-Vitamin D (CALCIUM + D PO) Take 600 mg by mouth 2 (two) times daily.        . Iron-FA-B  Cmp-C-Biot-Probiotic (FUSION PLUS) CAPS 1 capsule daily.      Marland Kitchen lisinopril (PRINIVIL,ZESTRIL) 5 MG tablet 1 tablet daily.      . Multiple Vitamins-Minerals (MULTIVITAMIN WITH MINERALS) tablet Take 1 tablet by  mouth daily.        Marland Kitchen NOVOLOG 100 UNIT/ML injection Inject into the skin as directed. Insulin pump      . PRISTIQ 50 MG 24 hr tablet Take 1 tablet by mouth Daily.      Marland Kitchen VYTORIN 10-20 MG per tablet Take 1 tablet by mouth Daily.       No current facility-administered medications for this visit.    Allergies as of 09/17/2013  . (No Known Allergies)    Vitals: BP 128/76  Pulse 96  Resp 17  Ht 5' 3.5" (1.613 m)  Wt 174 lb (78.926 kg)  BMI 30.34 kg/m2 Last Weight:  Wt Readings from Last 1 Encounters:  09/17/13 174 lb (78.926 kg)   Last Height:   Ht Readings from Last 1 Encounters:  09/17/13 5' 3.5" (1.613 m)   Vision Screening:  Vitals   Physical exam:  General: The patient is awake, alert and appears not in acute distress. The patient is well groomed. Head: Normocephalic, atraumatic. Neck is supple. Mallampati 3  neck circumference: Cardiovascular:  Regular rate and rhythm, without  murmurs or carotid bruit, and without distended neck veins.she has a short and thick neck and  open nasal airflow.   Respiratory: Lungs are clear to auscultation. Skin:  Without evidence of rash,  patient has slight facial puffiness.  Trunk: BMI is 30.8,  elevated and patient  has normal posture.  Neurologic exam : The patient is awake and alert, oriented to place and time.  Memory subjective  described as impaired, " forgetful" . MOCA tested upon patient's request. 29- out 30, couldn't generate F words.   There is a normal attention span & concentration ability. Speech is fluent without  dysarthria, dysphonia or aphasia. Mood and affect are appropriate.  Cranial nerves: Pupils are equal and briskly reactive to light. Funduscopic exam without  evidence of pallor or edema. Status post bilat  cataract surgery,  retinopathy.   Extraocular movements  in vertical and horizontal planes intact and without nystagmus. Visual fields by finger perimetry are intact. Hearing to finger rub intact.  Facial sensation intact to fine touch. Facial motor strength is symmetric and tongue and uvula move midline. No ptosis, full face .   Motor exam:   Normal tone , muscle bulk and symmetric normal strength in all extremities.  Sensory:  Fine touch, pinprick and vibration were tested in all extremities- in this diabetic patient. Proprioception was normal.  Coordination: Rapid alternating movements in the fingers/hands is tested and normal.   Gait and station: Patient walks without assistive device . Tandem gait is intact , she  turns with 3 Steps, which  are unfragmented. Romberg testing is normal.  Deep tendon reflexes: in the  upper and lower extremities are symmetric and intact. They are unexpectatly brisk - Babinski maneuver response is  downgoing.   Assessment:  After physical and neurologic examination, review of laboratory studies, imaging, neurophysiology testing and pre-existing records, assessment ; OSA treated on CPAP.  Download shows compliance and efficacy. 90 days.  Plan:  Treatment plan ; continue CPAP.   Weight re -addressd, exercise . Visit time 45 minute , new memory concern addressed.

## 2013-09-17 NOTE — Patient Instructions (Signed)
Obesity Obesity is having too much body fat and a body mass index (BMI) of 30 or more. BMI is a number based on your height and weight. The number is an estimate of how much body fat you have. Obesity can happen if you eat more calories than you can burn by exercising or other activity. It can cause major health problems or emergencies.  HOME CARE  Exercise and be active as told by your doctor. Try:  Using stairs when you can.  Parking farther away from store doors.  Gardening, biking, or walking.  Eat healthy foods and drinks that are low in calories. Eat more fruits and vegetables.  Limit fast food, sweets, and snack foods that are made with ingredients that are not natural (processed food).  Eat smaller amounts of food.  Keep a journal and write down what you eat every day. Websites can help with this.  Avoid drinking alcohol. Drink more water and drinks without calories.   Take vitamins and dietary pills (supplements) only as told by your doctor.  Try going to weight-loss support groups or classes to help lessen stress. Dieticians and counselors may also help. GET HELP RIGHT AWAY IF:  You have chest pain or tightness.  You have trouble breathing or feel short of breath.  You feel weak or have loss of feeling (numbness) in your legs.  You feel confused or have trouble talking.  You have sudden changes in your vision. MAKE SURE YOU:  Understand these instructions.  Will watch your condition.  Will get help right away if you are not doing well or get worse. Document Released: 07/10/2011 Document Reviewed: 07/10/2011 [facility] Patient Information 2014 [place].

## 2013-12-02 ENCOUNTER — Institutional Professional Consult (permissible substitution): Payer: BC Managed Care – PPO | Admitting: Pulmonary Disease

## 2013-12-04 ENCOUNTER — Encounter (INDEPENDENT_AMBULATORY_CARE_PROVIDER_SITE_OTHER): Payer: Self-pay

## 2013-12-04 ENCOUNTER — Ambulatory Visit (INDEPENDENT_AMBULATORY_CARE_PROVIDER_SITE_OTHER): Payer: BC Managed Care – PPO | Admitting: Pulmonary Disease

## 2013-12-04 ENCOUNTER — Encounter: Payer: Self-pay | Admitting: Pulmonary Disease

## 2013-12-04 VITALS — BP 130/72 | HR 64 | Temp 96.9°F | Ht 63.0 in | Wt 174.2 lb

## 2013-12-04 DIAGNOSIS — R05 Cough: Secondary | ICD-10-CM

## 2013-12-04 DIAGNOSIS — R053 Chronic cough: Secondary | ICD-10-CM | POA: Insufficient documentation

## 2013-12-04 DIAGNOSIS — R059 Cough, unspecified: Secondary | ICD-10-CM

## 2013-12-04 NOTE — Progress Notes (Signed)
   Subjective:    Patient ID: Emily Combs, female    DOB: 1956-01-02, 58 y.o.   MRN: [id number]  HPI The patient is a 58 year old female who I've been asked to see for a chronic cough. She tells me that she had an upper respiratory infection in May of this year, and was treated with antibiotics and a steroid injection. Her respiratory infection resolved, but a persistent cough continued to be an issue. She saw considerable improvement in her cough when she cleaned her CPAP machine, and currently it is rated a 2-3/10. She feels the cough is coming from her throat area, and describes atypical. It should be noted that she is on an ACE inhibitor. She denies significant throat clearing, but is doing this fairly often during my evaluation. She tells me that her cough is sometimes productive of mucus, and the majority of the time it is clear. She denies any nasal congestion, but does have significant postnasal drip. She has rare reflux, and this is not something that bothers her daily. She has no history of asthma or other pulmonary issues. She has had a recent chest x-ray that is unremarkable.   Review of Systems  Constitutional: Negative for fever and unexpected weight change.  HENT: Negative for congestion, dental problem, ear pain, nosebleeds, postnasal drip, rhinorrhea, sinus pressure, sneezing, sore throat and trouble swallowing.   Eyes: Negative for redness and itching.  Respiratory: Positive for cough. Negative for chest tightness, shortness of breath and wheezing.   Cardiovascular: Positive for leg swelling. Negative for palpitations.  Gastrointestinal: Negative for nausea and vomiting.  Genitourinary: Negative for dysuria.  Musculoskeletal: Negative for joint swelling.  Skin: Negative for rash.  Neurological: Negative for headaches.  Hematological: Does not bruise/bleed easily.  Psychiatric/Behavioral: Negative for dysphoric mood. The patient is nervous/anxious.        Objective:   Physical Exam Constitutional:  Overweight female, no acute distress  HENT:  Nares patent without discharge  Oropharynx without exudate, palate and uvula are normal  Eyes:  Perrla, eomi, no scleral icterus  Neck:  No JVD, no TMG  Cardiovascular:  Normal rate, regular rhythm, no rubs or gallops.  No murmurs        Intact distal pulses  Pulmonary :  Normal breath sounds, no stridor or respiratory distress   No rales, rhonchi, or wheezing  Abdominal:  Soft, nondistended, bowel sounds present.  No tenderness noted.   Musculoskeletal:  minimal lower extremity edema noted.  Lymph Nodes:  No cervical lymphadenopathy noted  Skin:  No cyanosis noted  Neurologic:  Alert, appropriate, moves all 4 extremities without obvious deficit.         Assessment & Plan:

## 2013-12-04 NOTE — Patient Instructions (Signed)
Hold claritin for next 2-3 weeks, and take chlorpheniramine 4mg  OTC in its place.  Take 2 at bedtime to see if helps cough Hold lisinopril, and check with Dr. Baldwin Crown office to make sure ok.  It may not be the actual cause of your cough, but it may be keeping your cough from getting better.  Use hard candy to bathe the back of your throat during the day.  No mint, cough drops, or menthol.   NO throat clearing if able, and limit voice use as much as possible. (no singing, yelling, or prolonged conversation). Can use tessalon pearls for cough during the day if needed.  Will need to give this at least 4 weeks off the lisinopril, and sometimes 8 weeks.  Please call me at 4 weeks with update on how your cough is doing.

## 2013-12-04 NOTE — Assessment & Plan Note (Signed)
The patient has a chronic cough but I suspect is upper airway in origin. She describes acute upper respiratory infection that resolved, but her cough has persisted. She is having a tickle in her throat, and is also on an ACE inhibitor. I think this needs to be discontinued until her cough has resolved, and then can be rechallenged if it is felt essential to her treatment. I also think we need to treat him aggressively for postnasal drip, and have also reviewed the behavioral therapies for cyclical coughing. I really do not see a lower airway issue here, with a clear chest x-ray, clear lung fields, and normal spirometry.

## 2013-12-05 ENCOUNTER — Institutional Professional Consult (permissible substitution): Payer: BC Managed Care – PPO | Admitting: Pulmonary Disease

## 2013-12-05 ENCOUNTER — Encounter: Payer: Self-pay | Admitting: Pulmonary Disease

## 2013-12-31 ENCOUNTER — Other Ambulatory Visit: Payer: Self-pay | Admitting: Gynecology

## 2014-01-01 LAB — CYTOLOGY - PAP

## 2014-01-13 ENCOUNTER — Encounter: Payer: Self-pay | Admitting: Internal Medicine

## 2014-03-17 ENCOUNTER — Encounter: Payer: Self-pay | Admitting: Neurology

## 2014-03-20 ENCOUNTER — Encounter: Payer: Self-pay | Admitting: Adult Health

## 2014-03-20 ENCOUNTER — Ambulatory Visit: Payer: BC Managed Care – PPO | Admitting: Adult Health

## 2014-03-20 ENCOUNTER — Ambulatory Visit (INDEPENDENT_AMBULATORY_CARE_PROVIDER_SITE_OTHER): Payer: BC Managed Care – PPO | Admitting: Adult Health

## 2014-03-20 VITALS — BP 130/75 | HR 80 | Temp 98.3°F | Ht 63.0 in | Wt 177.0 lb

## 2014-03-20 DIAGNOSIS — G4733 Obstructive sleep apnea (adult) (pediatric): Secondary | ICD-10-CM

## 2014-03-20 NOTE — Progress Notes (Addendum)
PATIENT: Emily Combs DOB: 05-06-1955  REASON FOR VISIT: follow up HISTORY FROM: patient  HISTORY OF PRESENT ILLNESS: Emily Combs is a 58 year old female with a history of obstructive sleep Apnea. She returns today for a 90 day compliance download. Patient did not bring her machine with her however she says that her DME company fax over her download. We have not received this. Patient states that she has used the machine nightly except for when she was sick recently. Her Epworth score is 9 points was previously 11 points. Her fatigue severity score is 36 was previously 39.  Patient reports that she gets about 7  hours of sleep a night. She goes to bed around 11-12 and arises at 7-8 AM. She denies having trouble falling asleep or staying a sleep. States that the she does not have to get up during the night to urinate. Overall patient feels that her sleepiness and fatigue has improved however she is unsure if it is due to the CPAP. Since the last visit the patient has had no new medical issues  ADDENDUM: 90 day download. Compliance 90%, AHI 3.0 on 10 cm H20. Uses her machine for 6 hours and 20 minutes. No leak.  HISTORY 09/17/13 [facility]): Emily Combs is a 58 y.o. female here as a revisit from Dr. Forde Dandy for a sleep consult. Emily Combs is a Caucasian, right handed female with a 67 year history of insulin-dependent diabetic mellitus type I, diagnosed at age 29. She started CPAP use last year . She is followed by [facility], Richardean Canal.  Her residual AHI is 2.00 - AHI during a sleep study was 17.8 per hour of sleep she is using a CPAP of 5 hours 40 minutes each night and heard compliance is 85% for the last 90 days this means that the patient has uses CPAP every day but not every day for 4 hours. The download was obtained on 07-02-13 at the height of tax season.  CPAP is set at 10 cm water , The patient states that she still could take a nap of several hours each afternoon if she would find  the time to do so.  Sometimes she will break up after the nap ,will go to sleep again at 10 PM , falls asleep promptly . she usually sleeps now better through the night.Her chief complaint is forgetfulness, rather than sleepiness           REVIEW OF SYSTEMS: Out of a complete 14 system review of symptoms, the patient complains only of the following symptoms, and all other reviewed systems are negative.  ALLERGIES: No Known Allergies  HOME MEDICATIONS: Outpatient Prescriptions Prior to Visit  Medication Sig Dispense Refill  . aspirin 81 MG tablet Take 162 mg by mouth daily.      . Calcium Carbonate-Vitamin D (CALCIUM + D PO) Take 600 mg by mouth 2 (two) times daily.      . Iron-FA-B Cmp-C-Biot-Probiotic (FUSION PLUS) CAPS 1 capsule daily.    Marland Kitchen loratadine (CLARITIN) 10 MG tablet Take 10 mg by mouth daily as needed for allergies.    . Multiple Vitamins-Minerals (MULTIVITAMIN WITH MINERALS) tablet Take 1 tablet by mouth daily.      Marland Kitchen NOVOLOG 100 UNIT/ML injection Inject into the skin as directed. Insulin pump    . PRISTIQ 50 MG 24 hr tablet Take 1 tablet by mouth Daily.    Marland Kitchen VYTORIN 10-20 MG per tablet Take 1 tablet by mouth Daily.    Marland Kitchen  benzonatate (TESSALON) 100 MG capsule Take 100 mg by mouth as needed.     Marland Kitchen HYDROcodone-homatropine (HYDROMET) 5-1.5 MG/5ML syrup Take 5 mLs by mouth every 6 (six) hours as needed for cough.    Marland Kitchen lisinopril (PRINIVIL,ZESTRIL) 5 MG tablet 1 tablet daily.     No facility-administered medications prior to visit.    PAST MEDICAL HISTORY: Past Medical History  Diagnosis Date  . Cataract   . Diabetes mellitus     type I  on insulin pump  . Hyperlipidemia   . Adenomatous colon polyp   . Depression   . Sleep apnea, obstructive      with AHI of 13.8 , RDI of 17.9 -SPLIT study at [facility]  06-21-12    PAST SURGICAL HISTORY: Past Surgical History  Procedure Laterality Date  . Wrist surgery  01/2011     right 2012      [id number]  . Carpal tunnel release  2000     bilateral  . Hands      trigger finger release/ multiple  . Cesarean section  1989  . Appendectomy  2007  . Trigger finger release  11/16/2011    Procedure: RELEASE TRIGGER FINGER/A-1 PULLEY;  Surgeon: Cammie Sickle., MD;  Location: [place];  Service: Orthopedics;  Laterality: Left;  Left hand dupuytrens and A-1 pulley release left thumb,left long and small fingers.  . Dupuytren contracture release  11/16/2011    Procedure: DUPUYTREN CONTRACTURE RELEASE;  Surgeon: Cammie Sickle., MD;  Location: [place];  Service: Orthopedics;  Laterality: Left;  . Cataract extraction  2013    FAMILY HISTORY: Family History  Problem Relation Age of Onset  . Colon cancer Paternal Grandmother   . Tuberculosis Maternal Aunt   . Tuberculosis Maternal Grandmother     SOCIAL HISTORY: History   Social History  . Marital Status: Legally Separated    Spouse Name: N/A    Number of Children: 1  . Years of Education: BS   Occupational History  . CPA    Social History Main Topics  . Smoking status: Never Smoker   . Smokeless tobacco: Never Used  . Alcohol Use: 0.6 oz/week    1 Glasses of wine per week     Comment: occasionally  . Drug Use: No  . Sexual Activity: Not on file   Other Topics Concern  . Not on file   Social History Narrative   Patient is divorced and lives at home with her son.   Patient is working full-time.   Patient has a BS degree.   Patient is right-handed.   Patient drinks three cups of coffee and occasionally tea.      PHYSICAL EXAM  Filed Vitals:   03/20/14 1028  BP: 130/75  Pulse: 80  Temp: 98.3 F (36.8 C)  TempSrc: Oral  Height: 5\' 3"  (1.6 m)  Weight: 177 lb (80.287 kg)   Body mass index is 31.36 kg/(m^2).  Generalized: Well developed, in no acute distress  Neck: 16-1/2 inches, Mallampati 4+  Neurological examination  Mentation: Alert oriented to time, place, history taking. Follows all commands speech and  language fluent Cranial nerve II-XII:  Extraocular movements were full, visual field were full on confrontational test. Facial sensation and strength were normal. Uvula tongue midline. Head turning and shoulder shrug  were normal and symmetric. Motor: The motor testing reveals 5 over 5 strength of all 4 extremities. Good symmetric motor tone is noted throughout.  Sensory:  Sensory testing is intact to soft touch on all 4 extremities. No evidence of extinction is noted.  Coordination: Cerebellar testing reveals good finger-nose-finger and heel-to-shin bilaterally.  Gait and station: Gait is normal.  Reflexes: Deep tendon reflexes are symmetric and normal bilaterally.     DIAGNOSTIC DATA (LABS, IMAGING, TESTING) - I reviewed patient records, labs, notes, testing and imaging myself where available.  Lab Results  Component Value Date   HGB 12.6 11/16/2011      Component Value Date/Time   NA 139 11/14/2011 1453   K 3.7 11/14/2011 1453   CL 101 11/14/2011 1453   CO2 27 11/14/2011 1453   GLUCOSE 242* 11/14/2011 1453   BUN 12 11/14/2011 1453   CREATININE 0.67 11/14/2011 1453   CALCIUM 9.8 11/14/2011 1453   GFRNONAA >90 11/14/2011 1453   GFRAA >90 11/14/2011 1453      ASSESSMENT AND PLAN 58 y.o. year old female  has a past medical history of Cataract; Diabetes mellitus; Hyperlipidemia; Adenomatous colon polyp; Depression; and Sleep apnea, obstructive. here with:  1. Obstructive sleep apnea CPAP  The patient reports that she's been using her CPAP nightly with the exception of when she was sick recently. Her DME company states that they faxed [place] a copy of her recent download. However we have not received this. Our nurse Lovey Newcomer has requested that they re- fax this information. I will put this in the system as soon as I receive it and I will call and let the patient know the results . Patient's Epworth and fatigue severity score has improved. Patient will follow-up in one year or sooner if  needed.  Ward Givens, MSN, NP-C 03/20/2014, 10:51 AM [facility] [address], [place], [place] [address] [phone]  Note: This document was prepared with digital dictation and possible smart phrase technology. Any transcriptional errors that result from this process are unintentional.

## 2014-03-20 NOTE — Patient Instructions (Signed)
Sleep Apnea  Sleep apnea is a sleep disorder characterized by abnormal pauses in breathing while you sleep. When your breathing pauses, the level of oxygen in your blood decreases. This causes you to move out of deep sleep and into light sleep. As a result, your quality of sleep is poor, and the system that carries your blood throughout your body (cardiovascular system) experiences stress. If sleep apnea remains untreated, the following conditions can develop:  High blood pressure (hypertension).  Coronary artery disease.  Inability to achieve or maintain an erection (impotence).  Impairment of your thought process (cognitive dysfunction). There are three types of sleep apnea: 1. Obstructive sleep apnea--Pauses in breathing during sleep because of a blocked airway. 2. Central sleep apnea--Pauses in breathing during sleep because the area of the brain that controls your breathing does not send the correct signals to the muscles that control breathing. 3. Mixed sleep apnea--A combination of both obstructive and central sleep apnea. RISK FACTORS The following risk factors can increase your risk of developing sleep apnea:  Being overweight.  Smoking.  Having narrow passages in your nose and throat.  Being of older age.  Being female.  Alcohol use.  Sedative and tranquilizer use.  Ethnicity. Among individuals younger than 35 years, African Americans are at increased risk of sleep apnea. SYMPTOMS   Difficulty staying asleep.  Daytime sleepiness and fatigue.  Loss of energy.  Irritability.  Loud, heavy snoring.  Morning headaches.  Trouble concentrating.  Forgetfulness.  Decreased interest in sex. DIAGNOSIS  In order to diagnose sleep apnea, your caregiver will perform a physical examination. Your caregiver may suggest that you take a home sleep test. Your caregiver may also recommend that you spend the night in a sleep lab. In the sleep lab, several monitors record  information about your heart, lungs, and brain while you sleep. Your leg and arm movements and blood oxygen level are also recorded. TREATMENT The following actions may help to resolve mild sleep apnea:  Sleeping on your side.   Using a decongestant if you have nasal congestion.   Avoiding the use of depressants, including alcohol, sedatives, and narcotics.   Losing weight and modifying your diet if you are overweight. There also are devices and treatments to help open your airway:  Oral appliances. These are custom-made mouthpieces that shift your lower jaw forward and slightly open your bite. This opens your airway.  Devices that create positive airway pressure. This positive pressure "splints" your airway open to help you breathe better during sleep. The following devices create positive airway pressure:  Continuous positive airway pressure (CPAP) device. The CPAP device creates a continuous level of air pressure with an air pump. The air is delivered to your airway through a mask while you sleep. This continuous pressure keeps your airway open.  Nasal expiratory positive airway pressure (EPAP) device. The EPAP device creates positive air pressure as you exhale. The device consists of single-use valves, which are inserted into each nostril and held in place by adhesive. The valves create very little resistance when you inhale but create much more resistance when you exhale. That increased resistance creates the positive airway pressure. This positive pressure while you exhale keeps your airway open, making it easier to breath when you inhale again.  Bilevel positive airway pressure (BPAP) device. The BPAP device is used mainly in patients with central sleep apnea. This device is similar to the CPAP device because it also uses an air pump to deliver continuous air pressure   through a mask. However, with the BPAP machine, the pressure is set at two different levels. The pressure when you  exhale is lower than the pressure when you inhale.  Surgery. Typically, surgery is only done if you cannot comply with less invasive treatments or if the less invasive treatments do not improve your condition. Surgery involves removing excess tissue in your airway to create a wider passage way. Document Released: 04/07/2002 Document Revised: 08/12/2012 Document Reviewed: 08/24/2011 ExitCare Patient Information 2015 [employer]. This information is not intended to replace advice given to you by your health care provider. Make sure you discuss any questions you have with your health care provider.  

## 2014-03-23 ENCOUNTER — Telehealth: Payer: Self-pay | Admitting: Adult Health

## 2014-03-23 NOTE — Progress Notes (Signed)
I agree with the assessment and plan as directed by NP .The patient is known to me .   Elysha Daw, MD  

## 2014-03-23 NOTE — Progress Notes (Signed)
I agree with the assessment and plan as directed by NP .The patient is known to me .   Britnee Mcdevitt, MD  

## 2014-03-23 NOTE — Telephone Encounter (Signed)
I called the patient and reviewed her sleep download. Her compliance was 90 %. She uses her machine for 5 hours and 42 minutes each night. No leak. Her AHI was 3.0 at 10 cm H20. 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 Patient verbalized understanding.

## 2014-04-13 ENCOUNTER — Other Ambulatory Visit: Payer: Self-pay | Admitting: Orthopedic Surgery

## 2014-04-28 ENCOUNTER — Encounter (HOSPITAL_BASED_OUTPATIENT_CLINIC_OR_DEPARTMENT_OTHER): Payer: Self-pay | Admitting: *Deleted

## 2014-04-28 NOTE — Progress Notes (Signed)
To come in for bmet-ekg-had had several hand surgeries here Insulin pump-

## 2014-04-30 ENCOUNTER — Ambulatory Visit (HOSPITAL_BASED_OUTPATIENT_CLINIC_OR_DEPARTMENT_OTHER): Payer: BC Managed Care – PPO | Admitting: Anesthesiology

## 2014-04-30 ENCOUNTER — Ambulatory Visit (HOSPITAL_BASED_OUTPATIENT_CLINIC_OR_DEPARTMENT_OTHER)
Admission: RE | Admit: 2014-04-30 | Discharge: 2014-04-30 | Disposition: A | Discharge status: Home / Self Care | Payer: BC Managed Care – PPO | Source: Ambulatory Visit | Attending: Orthopedic Surgery | Admitting: Orthopedic Surgery

## 2014-04-30 ENCOUNTER — Encounter (HOSPITAL_BASED_OUTPATIENT_CLINIC_OR_DEPARTMENT_OTHER)
Admission: RE | Disposition: A | Discharge status: Home / Self Care | Payer: Self-pay | Source: Ambulatory Visit | Attending: Orthopedic Surgery

## 2014-04-30 ENCOUNTER — Encounter (HOSPITAL_BASED_OUTPATIENT_CLINIC_OR_DEPARTMENT_OTHER): Payer: Self-pay | Admitting: *Deleted

## 2014-04-30 DIAGNOSIS — Z8611 Personal history of tuberculosis: Secondary | ICD-10-CM | POA: Diagnosis not present

## 2014-04-30 DIAGNOSIS — Z794 Long term (current) use of insulin: Secondary | ICD-10-CM | POA: Insufficient documentation

## 2014-04-30 DIAGNOSIS — Z86010 Personal history of colonic polyps: Secondary | ICD-10-CM | POA: Insufficient documentation

## 2014-04-30 DIAGNOSIS — M65331 Trigger finger, right middle finger: Secondary | ICD-10-CM | POA: Diagnosis present

## 2014-04-30 DIAGNOSIS — Z800 Family history of malignant neoplasm of digestive organs: Secondary | ICD-10-CM | POA: Diagnosis not present

## 2014-04-30 DIAGNOSIS — F329 Major depressive disorder, single episode, unspecified: Secondary | ICD-10-CM | POA: Insufficient documentation

## 2014-04-30 DIAGNOSIS — E119 Type 2 diabetes mellitus without complications: Secondary | ICD-10-CM | POA: Insufficient documentation

## 2014-04-30 DIAGNOSIS — G4733 Obstructive sleep apnea (adult) (pediatric): Secondary | ICD-10-CM | POA: Diagnosis not present

## 2014-04-30 DIAGNOSIS — E785 Hyperlipidemia, unspecified: Secondary | ICD-10-CM | POA: Insufficient documentation

## 2014-04-30 HISTORY — PX: TRIGGER FINGER RELEASE: SHX641

## 2014-04-30 HISTORY — DX: Presence of insulin pump (external) (internal): Z96.41

## 2014-04-30 LAB — POCT I-STAT, CHEM 8
BUN: 17 mg/dL (ref 6–23)
CREATININE: 0.6 mg/dL (ref 0.50–1.10)
Calcium, Ion: 1.23 mmol/L (ref 1.12–1.23)
Chloride: 99 mEq/L (ref 96–112)
Glucose, Bld: 166 mg/dL — ABNORMAL HIGH (ref 70–99)
HCT: 41 % (ref 36.0–46.0)
Hemoglobin: 13.9 g/dL (ref 12.0–15.0)
Potassium: 4 mmol/L (ref 3.5–5.1)
SODIUM: 140 mmol/L (ref 135–145)
TCO2: 27 mmol/L (ref 0–100)

## 2014-04-30 LAB — GLUCOSE, CAPILLARY: Glucose-Capillary: 122 mg/dL — ABNORMAL HIGH (ref 70–99)

## 2014-04-30 SURGERY — RELEASE, A1 PULLEY, FOR TRIGGER FINGER
Anesthesia: General | Site: Finger | Laterality: Right

## 2014-04-30 MED ORDER — ONDANSETRON HCL 4 MG/2ML IJ SOLN: 4.0000 mg | Freq: Four times a day (QID) | INTRAMUSCULAR | Status: DC | PRN

## 2014-04-30 MED ORDER — FENTANYL CITRATE 0.05 MG/ML IJ SOLN: 25.0000 ug | INTRAMUSCULAR | Status: DC | PRN

## 2014-04-30 MED ORDER — ONDANSETRON HCL 4 MG/2ML IJ SOLN
INTRAMUSCULAR | Status: DC | PRN
  Administered 2014-04-30: 4 mg via INTRAVENOUS

## 2014-04-30 MED ORDER — MIDAZOLAM HCL 2 MG/2ML IJ SOLN: 1.0000 mg | INTRAMUSCULAR | Status: DC | PRN

## 2014-04-30 MED ORDER — CEFAZOLIN SODIUM-DEXTROSE 2-3 GM-% IV SOLR
2.0000 g | INTRAVENOUS | Status: AC
  Administered 2014-04-30: 2 g via INTRAVENOUS

## 2014-04-30 MED ORDER — PROPOFOL 10 MG/ML IV BOLUS
INTRAVENOUS | Status: DC | PRN
  Administered 2014-04-30: 150 mg via INTRAVENOUS

## 2014-04-30 MED ORDER — DEXAMETHASONE SODIUM PHOSPHATE 10 MG/ML IJ SOLN
INTRAMUSCULAR | Status: DC | PRN
  Administered 2014-04-30: 5 mg via INTRAVENOUS

## 2014-04-30 MED ORDER — CEFAZOLIN SODIUM-DEXTROSE 2-3 GM-% IV SOLR
INTRAVENOUS | Status: AC
  Filled 2014-04-30: qty 50

## 2014-04-30 MED ORDER — FENTANYL CITRATE 0.05 MG/ML IJ SOLN
INTRAMUSCULAR | Status: AC
  Filled 2014-04-30: qty 2

## 2014-04-30 MED ORDER — LACTATED RINGERS IV SOLN
INTRAVENOUS | Status: DC
  Administered 2014-04-30 (×2): via INTRAVENOUS

## 2014-04-30 MED ORDER — HYDROCODONE-ACETAMINOPHEN 5-325 MG PO TABS: ORAL_TABLET | ORAL | Status: DC

## 2014-04-30 MED ORDER — MIDAZOLAM HCL 2 MG/2ML IJ SOLN
INTRAMUSCULAR | Status: AC
  Filled 2014-04-30: qty 2

## 2014-04-30 MED ORDER — MIDAZOLAM HCL 5 MG/5ML IJ SOLN
INTRAMUSCULAR | Status: DC | PRN
  Administered 2014-04-30: 2 mg via INTRAVENOUS

## 2014-04-30 MED ORDER — CHLORHEXIDINE GLUCONATE 4 % EX LIQD: 60.0000 mL | Freq: Once | CUTANEOUS | Status: DC

## 2014-04-30 MED ORDER — LIDOCAINE HCL (CARDIAC) 20 MG/ML IV SOLN
INTRAVENOUS | Status: DC | PRN
  Administered 2014-04-30: 60 mg via INTRAVENOUS

## 2014-04-30 MED ORDER — FENTANYL CITRATE 0.05 MG/ML IJ SOLN: 50.0000 ug | INTRAMUSCULAR | Status: DC | PRN

## 2014-04-30 MED ORDER — FENTANYL CITRATE 0.05 MG/ML IJ SOLN
INTRAMUSCULAR | Status: DC | PRN
  Administered 2014-04-30: 100 ug via INTRAVENOUS

## 2014-04-30 MED ORDER — BUPIVACAINE HCL (PF) 0.25 % IJ SOLN
INTRAMUSCULAR | Status: DC | PRN
  Administered 2014-04-30: 5 mL

## 2014-04-30 SURGICAL SUPPLY — 37 items
BANDAGE COBAN STERILE 2 (GAUZE/BANDAGES/DRESSINGS) ×2 IMPLANT
BLADE MINI RND TIP GREEN BEAV (BLADE) IMPLANT
BLADE SURG 15 STRL LF DISP TIS (BLADE) ×2 IMPLANT
BLADE SURG 15 STRL SS (BLADE) ×4
BNDG CMPR 9X4 STRL LF SNTH (GAUZE/BANDAGES/DRESSINGS) ×1
BNDG CONFORM 2 STRL LF (GAUZE/BANDAGES/DRESSINGS) ×2 IMPLANT
BNDG ESMARK 4X9 LF (GAUZE/BANDAGES/DRESSINGS) ×1 IMPLANT
CHLORAPREP W/TINT 26ML (MISCELLANEOUS) ×2 IMPLANT
CORDS BIPOLAR (ELECTRODE) ×2 IMPLANT
COVER BACK TABLE 60X90IN (DRAPES) ×2 IMPLANT
COVER MAYO STAND STRL (DRAPES) ×2 IMPLANT
CUFF TOURNIQUET SINGLE 18IN (TOURNIQUET CUFF) ×2 IMPLANT
DRAPE EXTREMITY T 121X128X90 (DRAPE) ×2 IMPLANT
DRAPE SURG 17X23 STRL (DRAPES) ×2 IMPLANT
GAUZE SPONGE 4X4 12PLY STRL (GAUZE/BANDAGES/DRESSINGS) ×2 IMPLANT
GAUZE XEROFORM 1X8 LF (GAUZE/BANDAGES/DRESSINGS) ×2 IMPLANT
GLOVE BIO SURGEON STRL SZ 6.5 (GLOVE) ×1 IMPLANT
GLOVE BIO SURGEON STRL SZ7.5 (GLOVE) ×2 IMPLANT
GLOVE BIOGEL PI IND STRL 7.0 (GLOVE) IMPLANT
GLOVE BIOGEL PI IND STRL 8 (GLOVE) ×1 IMPLANT
GLOVE BIOGEL PI INDICATOR 7.0 (GLOVE) ×1
GLOVE BIOGEL PI INDICATOR 8 (GLOVE) ×1
GOWN STRL REUS W/ TWL LRG LVL3 (GOWN DISPOSABLE) ×1 IMPLANT
GOWN STRL REUS W/TWL LRG LVL3 (GOWN DISPOSABLE) ×2
GOWN STRL REUS W/TWL XL LVL3 (GOWN DISPOSABLE) ×2 IMPLANT
NDL HYPO 25X1 1.5 SAFETY (NEEDLE) IMPLANT
NEEDLE HYPO 25X1 1.5 SAFETY (NEEDLE) IMPLANT
NS IRRIG 1000ML POUR BTL (IV SOLUTION) ×2 IMPLANT
PACK BASIN DAY SURGERY FS (CUSTOM PROCEDURE TRAY) ×2 IMPLANT
PADDING CAST ABS 4INX4YD NS (CAST SUPPLIES) ×1
PADDING CAST ABS COTTON 4X4 ST (CAST SUPPLIES) ×1 IMPLANT
STOCKINETTE 4X48 STRL (DRAPES) ×2 IMPLANT
SUT ETHILON 4 0 PS 2 18 (SUTURE) ×2 IMPLANT
SYR BULB 3OZ (MISCELLANEOUS) ×2 IMPLANT
SYR CONTROL 10ML LL (SYRINGE) IMPLANT
TOWEL OR 17X24 6PK STRL BLUE (TOWEL DISPOSABLE) ×4 IMPLANT
UNDERPAD 30X30 INCONTINENT (UNDERPADS AND DIAPERS) ×2 IMPLANT

## 2014-04-30 NOTE — Transfer of Care (Signed)
Immediate Anesthesia Transfer of Care Note  Patient: Emily Combs [facility]  Procedure(s) Performed: Procedure(s): RIGHT LONG TRIGGER RELEASE (Right)  Patient Location: PACU  Anesthesia Type:General  Level of Consciousness: sedated  Airway & Oxygen Therapy: Patient Spontanous Breathing and Patient connected to face mask oxygen  Post-op Assessment: Report given to PACU RN and Post -op Vital signs reviewed and stable  Post vital signs: Reviewed and stable  Complications: No apparent anesthesia complications

## 2014-04-30 NOTE — Anesthesia Procedure Notes (Signed)
Procedure Name: LMA Insertion Date/Time: 04/30/2014 12:13 PM Performed by: Maryella Shivers Pre-anesthesia Checklist: Patient identified, Emergency Drugs available, Suction available and Patient being monitored Patient Re-evaluated:Patient Re-evaluated prior to inductionOxygen Delivery Method: Circle System Utilized Preoxygenation: Pre-oxygenation with 100% oxygen Intubation Type: IV induction Ventilation: Mask ventilation without difficulty LMA: LMA inserted LMA Size: 4.0 Number of attempts: 1 Airway Equipment and Method: bite block Placement Confirmation: positive ETCO2 Tube secured with: Tape Dental Injury: Teeth and Oropharynx as per pre-operative assessment

## 2014-04-30 NOTE — H&P (Signed)
Emily Combs is an 58 y.o. female.   Chief Complaint: right long finger trigger digit HPI: 57 yo female with triggering of right long finger.  This has been injected twice with recurrence.  It is bothersome to her and she wishes to have a trigger release.  Past Medical History  Diagnosis Date  . Cataract   . Diabetes mellitus     type I  on insulin pump  . Hyperlipidemia   . Adenomatous colon polyp   . Depression   . Sleep apnea, obstructive      with AHI of 13.8 , RDI of 17.9 -SPLIT study at [facility]  06-21-12-uses cpap  . Insulin pump in place     Past Surgical History  Procedure Laterality Date  . Wrist surgery  01/2011     right 2012      [id number]  . Carpal tunnel release  2000    bilateral  . Hands      trigger finger release/ multiple  . Cesarean section  1989  . Appendectomy  2007  . Trigger finger release  11/16/2011    Procedure: RELEASE TRIGGER FINGER/A-1 PULLEY;  Surgeon: Cammie Sickle., MD;  Location: [facility];  Service: Orthopedics;  Laterality: Left;  Left hand dupuytrens and A-1 pulley release left thumb,left long and small fingers.  . Dupuytren contracture release  11/16/2011    Procedure: DUPUYTREN CONTRACTURE RELEASE;  Surgeon: Cammie Sickle., MD;  Location: [facility];  Service: Orthopedics;  Laterality: Left;  . Cataract extraction  2013    both  . Eye surgery    . Colonoscopy      Family History  Problem Relation Age of Onset  . Colon cancer Paternal Grandmother   . Tuberculosis Maternal Aunt   . Tuberculosis Maternal Grandmother    Social History:  reports that she has never smoked. She has never used smokeless tobacco. She reports that she drinks about 0.6 oz of alcohol per week. She reports that she does not use illicit drugs.  Allergies: No Known Allergies  Medications Prior to Admission  Medication Sig Dispense Refill  . aspirin 81 MG tablet Take 162 mg by mouth daily.      . Calcium Carbonate-Vitamin  D (CALCIUM + D PO) Take 600 mg by mouth 2 (two) times daily.      . Iron-FA-B Cmp-C-Biot-Probiotic (FUSION PLUS) CAPS 1 capsule daily.    Marland Kitchen loratadine (CLARITIN) 10 MG tablet Take 10 mg by mouth daily as needed for allergies.    . Multiple Vitamins-Minerals (MULTIVITAMIN WITH MINERALS) tablet Take 1 tablet by mouth daily.      Marland Kitchen NOVOLOG 100 UNIT/ML injection Inject into the skin as directed. Insulin pump    . PRISTIQ 50 MG 24 hr tablet Take 1 tablet by mouth Daily.    Marland Kitchen VYTORIN 10-20 MG per tablet Take 1 tablet by mouth Daily.      No results found for this or any previous visit (from the past 48 hour(s)).  No results found.   A comprehensive review of systems was negative except for: Eyes: positive for cataracts and contacts/glasses Respiratory: positive for cough Musculoskeletal: positive for joint swelling  Height 5\' 3"  (1.6 m), weight 79.379 kg (175 lb).  General appearance: alert, cooperative and appears stated age Head: Normocephalic, without obvious abnormality, atraumatic Neck: supple, symmetrical, trachea midline Resp: clear to auscultation bilaterally Cardio: regular rate and rhythm GI: non tender Extremities: intact sensation and capillary refill all  digits.  +epl/fpl/io.  no wounds Pulses: 2+ and symmetric Skin: Skin color, texture, turgor normal. No rashes or lesions Neurologic: Grossly normal Incision/Wound: none  Assessment/Plan Right long finger trigger digit.  Non operative and operative treatment options were discussed with the patient and patient wishes to proceed with operative treatment. Risks, benefits, and alternatives of surgery were discussed and the patient agrees with the plan of care.   Emily Combs R 04/30/2014, 10:37 AM

## 2014-04-30 NOTE — Brief Op Note (Signed)
04/30/2014  12:34 PM  PATIENT:  Phylis Bougie Mcmahen  58 y.o. female  PRE-OPERATIVE DIAGNOSIS:  RIGHT LONG FINGER TRIGGER DIGIT  POST-OPERATIVE DIAGNOSIS:  right long finger trigger   PROCEDURE:  Procedure(s): RIGHT LONG TRIGGER RELEASE (Right)  SURGEON:  Surgeon(s) and Role:    * Leanora Cover, MD - Primary  PHYSICIAN ASSISTANT:   ASSISTANTS: none   ANESTHESIA:   general  EBL:  Total I/O In: 1000 [I.V.:1000] Out: -   BLOOD ADMINISTERED:none  DRAINS: none   LOCAL MEDICATIONS USED:  MARCAINE     SPECIMEN:  No Specimen  DISPOSITION OF SPECIMEN:  N/A  COUNTS:  YES  TOURNIQUET:   Total Tourniquet Time Documented: Upper Arm (Right) - 12 minutes Total: Upper Arm (Right) - 12 minutes   DICTATION: .Other Dictation: Dictation Number [id number]  PLAN OF CARE: Discharge to home after PACU  PATIENT DISPOSITION:  PACU - hemodynamically stable.

## 2014-04-30 NOTE — Op Note (Signed)
[id number] 

## 2014-04-30 NOTE — Anesthesia Postprocedure Evaluation (Signed)
Anesthesia Post Note  Patient: Emily Combs  Procedure(s) Performed: Procedure(s) (LRB): RIGHT LONG TRIGGER RELEASE (Right)  Anesthesia type: General  Patient location: PACU  Post pain: Pain level controlled and Adequate analgesia  Post assessment: Post-op Vital signs reviewed, Patient's Cardiovascular Status Stable, Respiratory Function Stable, Patent Airway and Pain level controlled  Last Vitals:  Filed Vitals:   04/30/14 1315  BP: 124/76  Pulse: 88  Temp:   Resp: 13    Post vital signs: Reviewed and stable  Level of consciousness: awake, alert  and oriented  Complications: No apparent anesthesia complications

## 2014-04-30 NOTE — Discharge Instructions (Addendum)

## 2014-04-30 NOTE — Anesthesia Preprocedure Evaluation (Signed)
Anesthesia Evaluation  Patient identified by MRN, date of birth, ID band Patient awake    Reviewed: Allergy & Precautions, H&P , NPO status , Patient's Chart, lab work & pertinent test results  Airway Mallampati: II   Neck ROM: full    Dental   Pulmonary sleep apnea ,          Cardiovascular negative cardio ROS      Neuro/Psych Depression    GI/Hepatic   Endo/Other  diabetes, Type 2obese  Renal/GU      Musculoskeletal   Abdominal   Peds  Hematology   Anesthesia Other Findings   Reproductive/Obstetrics                             Anesthesia Physical Anesthesia Plan  ASA: II  Anesthesia Plan: General   Post-op Pain Management:    Induction: Intravenous  Airway Management Planned: LMA  Additional Equipment:   Intra-op Plan:   Post-operative Plan:   Informed Consent: I have reviewed the patients History and Physical, chart, labs and discussed the procedure including the risks, benefits and alternatives for the proposed anesthesia with the patient or authorized representative who has indicated his/her understanding and acceptance.     Plan Discussed with: CRNA, Anesthesiologist and Surgeon  Anesthesia Plan Comments:         Anesthesia Quick Evaluation

## 2014-05-01 NOTE — Op Note (Signed)
Emily Combs                ACCOUNT NO.:  [id number]  MEDICAL RECORD NO.:  [id number]  LOCATION:                                 FACILITY:  PHYSICIAN:  Leanora Cover, MD             DATE OF BIRTH:  DATE OF PROCEDURE:  04/30/2014 DATE OF DISCHARGE:                              OPERATIVE REPORT   PREOPERATIVE DIAGNOSIS:  Right long finger trigger digit.  POSTOPERATIVE DIAGNOSIS:  Right long finger trigger digit.  PROCEDURE:  Right long finger trigger release.  SURGEON:  Leanora Cover, MD  ASSISTANT:  None.  ANESTHESIA:  General.  IV FLUIDS:  Per Anesthesia flow sheet.  ESTIMATED BLOOD LOSS:  Minimal.  COMPLICATIONS:  None.  SPECIMENS:  None.  TOURNIQUET TIME:  Twelve minutes.  DISPOSITION:  Stable to PACU.  INDICATIONS:  Emily Combs is a 59 year old female who has had triggering of right long finger.  This has been injected with recurrence.  She wished to have it released.  Risks, benefits, and alternatives of surgery were discussed including risk of blood loss, infection, damage to nerves, vessels, tendons, ligaments, bone; failure of surgery; need for additional surgery, complications with wound healing, continued pain, recurrence and of triggering.  She voiced understanding of these risks and elected to proceed.  OPERATIVE COURSE:  After being identified preoperatively by myself, the patient and I agreed upon procedure and site procedure.  Surgical site was marked.  The risks, benefits, and alternatives of surgery were reviewed and she wished to proceed.  Surgical consent had been signed. She was given IV Ancef as preoperative antibiotic prophylaxis.  She was transported to the operating room and placed on the operating room table in supine position with the right upper extremity on arm board.  General anesthesia was induced by the anesthesiologist.  Right upper extremity was prepped and draped in normal sterile orthopedic fashion.  Surgical pause was  performed between surgeons, anesthesia, and operating room staff, and all were in agreement as to the patient, procedure, and site procedure.  Tourniquet at the proximal aspect of the extremity was inflated to 250 mmHg after exsanguination of the limb with Esmarch bandage.  Incision was made over the A1 pulley and carried into subcutaneous tissues by spreading technique.  There was a cyst formation on the sheath.  The A1 pulley was sharply incised.  The proximal 1-2 mm of the A2 pulley was incised to allow final for the flexor tendons.  The finger was placed through range of motion.  There was no triggering. The tendons were brought through the wound and there was significant adherence between them which was released.  The wound was copiously irrigated with sterile saline.  It was closed with 4-0 nylon in a horizontal mattress fashion.  It was injected with 5 mL of 0.25% plain Marcaine to aid in postoperative analgesia.  This was then dressed with sterile Xeroform, 4x4s, and wrapped with a Kling and Coban dressing lightly.  The tourniquet was deflated at 12 minutes.  Fingertips were pink with brisk capillary refill after deflation of the tourniquet. Operative drapes were broken down.  The patient was awoken from anesthesia  safely.  She was transferred back to stretcher and taken to PACU in stable condition.  I will see her back in the office in 1 week for postoperative followup.  I will give her Norco 5/325, 1-2 p.o. q.6 hours p.r.n. pain, dispensed #20.     Leanora Cover, MD     KK/MEDQ  D:  04/30/2014  T:  05/01/2014  Job:  [id number]

## 2014-05-04 ENCOUNTER — Encounter (HOSPITAL_BASED_OUTPATIENT_CLINIC_OR_DEPARTMENT_OTHER): Payer: Self-pay | Admitting: Orthopedic Surgery

## 2014-11-24 ENCOUNTER — Telehealth: Payer: Self-pay | Admitting: Neurology

## 2014-11-24 DIAGNOSIS — G4733 Obstructive sleep apnea (adult) (pediatric): Secondary | ICD-10-CM

## 2014-11-24 NOTE — Telephone Encounter (Signed)
Pt says that she had her sleep study done here at [facility] and would like her new DME to be [facility]. I informed her that I would obtain her sleep studies and then get an order to [facility] and she should hear from them within a week but if she hasn't, to call and let me know.

## 2014-11-24 NOTE — Telephone Encounter (Signed)
The DME company doesn't sell [employer] products anymore.  She needs a prescription to go to another DME company for her supplies.  Please call her and let her know what to do

## 2014-11-24 NOTE — Telephone Encounter (Signed)
Returned pt's call, no answer. Left a message asking pt to call me back.  I need a copy of pt's sleep studies, which I cannot find in Epic. Once I have her sleep studies, I can refer her to another DME.

## 2015-01-06 ENCOUNTER — Other Ambulatory Visit: Payer: Self-pay | Admitting: Gynecology

## 2015-01-07 LAB — CYTOLOGY - PAP

## 2015-03-22 ENCOUNTER — Ambulatory Visit: Payer: BC Managed Care – PPO | Admitting: Adult Health

## 2015-03-31 ENCOUNTER — Encounter: Payer: Self-pay | Admitting: Adult Health

## 2015-03-31 ENCOUNTER — Ambulatory Visit (INDEPENDENT_AMBULATORY_CARE_PROVIDER_SITE_OTHER): Payer: BLUE CROSS/BLUE SHIELD | Admitting: Adult Health

## 2015-03-31 ENCOUNTER — Telehealth: Payer: Self-pay | Admitting: Adult Health

## 2015-03-31 VITALS — BP 116/71 | HR 90 | Ht 63.0 in | Wt 166.5 lb

## 2015-03-31 DIAGNOSIS — G4733 Obstructive sleep apnea (adult) (pediatric): Secondary | ICD-10-CM

## 2015-03-31 DIAGNOSIS — Z9989 Dependence on other enabling machines and devices: Principal | ICD-10-CM

## 2015-03-31 NOTE — Telephone Encounter (Signed)
Called patient. No answer. Will try later.  

## 2015-03-31 NOTE — Patient Instructions (Addendum)
Please make an appointment with your DME company and have them send [place] a download.  Continue using the CPAP nightly. If your symptoms worsen or you develop new symptoms please let us know.

## 2015-03-31 NOTE — Telephone Encounter (Signed)
I received a CPAP download from [facility] for 09/03/2014 to 12/01/2014. The patient uses her machine 83 out of 90 days for compliance of 92.2%. On average she uses her machine 7 hours and 20 minutes. She uses her machine greater than 4 hours 91.1%. Her residual AHI is 2.8 on 10 cm of water. 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

## 2015-03-31 NOTE — Progress Notes (Signed)
I agree with the assessment and plan as directed by NP .The patient is known to me .   Artrell Lawless, MD  

## 2015-03-31 NOTE — Progress Notes (Signed)
PATIENT: Emily Combs DOB: 08-24-1955  REASON FOR VISIT: follow up- obstructive sleep apnea HISTORY FROM: patient  HISTORY OF PRESENT ILLNESS: Emily Combs is a 59 year old female with a history of obstructive sleep apnea. She returns today for a compliance download. She didn't bring her memory card however we are unable to receive any data. Patient reports that she does use her CPAP nightly. She typically goes to bed around 11 or 12 PM and arises between 7 8 AM. Her Epworth sleepiness score is was previously 9 and fatigue severity score is was previously 36. She denies any trouble falling asleep or staying asleep. She denies any new neurological symptoms. She returns today for an evaluation.   HISTORY 03/20/14: Emily Combs is a 59 year old female with a history of obstructive sleep Apnea. She returns today for a 90 day compliance download. Patient did not bring her machine with her however she says that her DME company fax over her download. We have not received this. Patient states that she has used the machine nightly except for when she was sick recently. Her Epworth score is 9 points was previously 11 points. Her fatigue severity score is 36 was previously 39. Patient reports that she gets about 7 hours of sleep a night. She goes to bed around 11-12 and arises at 7-8 AM. She denies having trouble falling asleep or staying a sleep. States that the she does not have to get up during the night to urinate. Overall patient feels that her sleepiness and fatigue has improved however she is unsure if it is due to the CPAP. Since the last visit the patient has had no new medical issues  HISTORY 09/17/13 [facility]): Emily Combs is a 59 y.o. female here as a revisit from Dr. Forde Dandy for a sleep consult. Emily Combs is a Caucasian, right handed female with a 53 year history of insulin-dependent diabetic mellitus type I, diagnosed at age 41. She started CPAP use last year . She is followed by [facility], [place]. Her residual AHI is 2.00 - AHI during a sleep study was 17.8 per hour of sleep she is using a CPAP of 5 hours 40 minutes each night and heard compliance is 85% for the last 90 days this means that the patient has uses CPAP every day but not every day for 4 hours. The download was obtained on 07-02-13 at the height of tax season. CPAP is set at 10 cm water , The patient states that she still could take a nap of several hours each afternoon if she would find the time to do so. Sometimes she will break up after the nap ,will go to sleep again at 10 PM , falls asleep promptly . she usually sleeps now better through the night.Her chief complaint is forgetfulness, rather than sleepiness  REVIEW OF SYSTEMS: Out of a complete 14 system review of symptoms, the patient complains only of the following symptoms, and all other reviewed systems are negative.  ALLERGIES: No Known Allergies  HOME MEDICATIONS: Outpatient Prescriptions Prior to Visit  Medication Sig Dispense Refill  . aspirin 81 MG tablet Take 162 mg by mouth daily.      . Calcium Carbonate-Vitamin D (CALCIUM + D PO) Take 600 mg by mouth 2 (two) times daily.      Marland Kitchen loratadine (CLARITIN) 10 MG tablet Take 10 mg by mouth daily as needed for allergies.    . Multiple Vitamins-Minerals (MULTIVITAMIN WITH MINERALS) tablet Take 1  tablet by mouth daily.      Marland Kitchen NOVOLOG 100 UNIT/ML injection Inject into the skin as directed. Insulin pump    . PRISTIQ 50 MG 24 hr tablet Take 1 tablet by mouth Daily.    Marland Kitchen VYTORIN 10-20 MG per tablet Take 1 tablet by mouth Daily.    Marland Kitchen HYDROcodone-acetaminophen (NORCO) 5-325 MG per tablet 1-2 tabs po q6 hours prn pain 20 tablet 0  . Iron-FA-B Cmp-C-Biot-Probiotic (FUSION PLUS) CAPS 1 capsule daily.     No facility-administered medications prior to visit.    PAST MEDICAL HISTORY: Past Medical History  Diagnosis Date  . Cataract   . Diabetes mellitus     type I  on insulin pump  . Hyperlipidemia     . Adenomatous colon polyp   . Depression   . Sleep apnea, obstructive      with AHI of 13.8 , RDI of 17.9 -SPLIT study at [facility]  06-21-12-uses cpap  . Insulin pump in place     PAST SURGICAL HISTORY: Past Surgical History  Procedure Laterality Date  . Wrist surgery  01/2011     right 2012      [id number]  . Carpal tunnel release  2000    bilateral  . Hands      trigger finger release/ multiple  . Cesarean section  1989  . Appendectomy  2007  . Trigger finger release  11/16/2011    Procedure: RELEASE TRIGGER FINGER/A-1 PULLEY;  Surgeon: Cammie Sickle., MD;  Location: [facility];  Service: Orthopedics;  Laterality: Left;  Left hand dupuytrens and A-1 pulley release left thumb,left long and small fingers.  . Dupuytren contracture release  11/16/2011    Procedure: DUPUYTREN CONTRACTURE RELEASE;  Surgeon: Cammie Sickle., MD;  Location: [place];  Service: Orthopedics;  Laterality: Left;  . Cataract extraction  2013    both  . Eye surgery    . Colonoscopy    . Trigger finger release Right 04/30/2014    Procedure: RIGHT LONG TRIGGER RELEASE;  Surgeon: Leanora Cover, MD;  Location: [facility];  Service: Orthopedics;  Laterality: Right;    FAMILY HISTORY: Family History  Problem Relation Age of Onset  . Colon cancer Paternal Grandmother   . Tuberculosis Maternal Aunt   . Tuberculosis Maternal Grandmother     SOCIAL HISTORY: Social History   Social History  . Marital Status: Legally Separated    Spouse Name: N/A  . Number of Children: 1  . Years of Education: BS   Occupational History  . CPA    Social History Main Topics  . Smoking status: Never Smoker   . Smokeless tobacco: Never Used  . Alcohol Use: 0.6 oz/week    1 Glasses of wine per week     Comment: occasionally  . Drug Use: No  . Sexual Activity: Not on file   Other Topics Concern  . Not on file   Social History Narrative   Patient is divorced and lives at  home with her son.   Patient is working full-time.   Patient has a BS degree.   Patient is right-handed.   Patient drinks three cups of coffee and occasionally tea.      PHYSICAL EXAM  Filed Vitals:   03/31/15 1453  BP: 116/71  Pulse: 90  Height: 5\' 3"  (1.6 m)  Weight: 166 lb 8 oz (75.524 kg)   Body mass index is 29.5 kg/(m^2).  Generalized: Well developed,  in no acute distress   Neurological examination  Mentation: Alert oriented to time, place, history taking. Follows all commands speech and language fluent Cranial nerve II-XII: Pupils were equal round reactive to light. Extraocular movements were full, visual field were full on confrontational test. Facial sensation and strength were normal. Uvula tongue midline. Head turning and shoulder shrug  were normal and symmetric. Motor: The motor testing reveals 5 over 5 strength of all 4 extremities. Good symmetric motor tone is noted throughout.  Sensory: Sensory testing is intact to soft touch on all 4 extremities. No evidence of extinction is noted.  Coordination: Cerebellar testing reveals good finger-nose-finger and heel-to-shin bilaterally.  Gait and station: Gait is normal. Tandem gait is normal. Romberg is negative. No drift is seen.  Reflexes: Deep tendon reflexes are symmetric and normal bilaterally.   DIAGNOSTIC DATA (LABS, IMAGING, TESTING) - I reviewed patient records, labs, notes, testing and imaging myself where available.   ASSESSMENT AND PLAN 59 y.o. year old female  has a past medical history of Cataract; Diabetes mellitus; Hyperlipidemia; Adenomatous colon polyp; Depression; Sleep apnea, obstructive; and Insulin pump in place. here with:  1. Obstructive sleep apnea  Unfortunately we were unable to obtain a download from her memory card. I have advised the patient that she needs to make an appointment with her DME company to have a download sent to our office. Her memory card may need to be replaced. Patient  verbalized understanding. Encouraged the patient to continue using the CPAP nightly. Advised patient that if her symptoms worsen or she develops any new symptoms she should let us know. She will follow-up in one year with Dr. Brett Fairy.   Ward Givens, MSN, NP-C 03/31/2015, 3:06 PM [facility] [address] [place], [place] [address] [phone]

## 2015-04-01 NOTE — Telephone Encounter (Signed)
Spoke to patient. Went over [employer] previous note. Patient verbalized understanding. She says she will call [facility] to see why machine unable to be downloaded in office.

## 2015-09-13 DIAGNOSIS — E109 Type 1 diabetes mellitus without complications: Secondary | ICD-10-CM | POA: Diagnosis not present

## 2015-09-22 DIAGNOSIS — K219 Gastro-esophageal reflux disease without esophagitis: Secondary | ICD-10-CM | POA: Diagnosis not present

## 2015-09-22 DIAGNOSIS — E784 Other hyperlipidemia: Secondary | ICD-10-CM | POA: Diagnosis not present

## 2015-09-22 DIAGNOSIS — Z1389 Encounter for screening for other disorder: Secondary | ICD-10-CM | POA: Diagnosis not present

## 2015-09-22 DIAGNOSIS — E1021 Type 1 diabetes mellitus with diabetic nephropathy: Secondary | ICD-10-CM | POA: Diagnosis not present

## 2015-10-20 DIAGNOSIS — H43812 Vitreous degeneration, left eye: Secondary | ICD-10-CM | POA: Diagnosis not present

## 2015-10-20 DIAGNOSIS — H2513 Age-related nuclear cataract, bilateral: Secondary | ICD-10-CM | POA: Diagnosis not present

## 2015-10-20 DIAGNOSIS — H35043 Retinal micro-aneurysms, unspecified, bilateral: Secondary | ICD-10-CM | POA: Diagnosis not present

## 2015-10-20 DIAGNOSIS — E103293 Type 1 diabetes mellitus with mild nonproliferative diabetic retinopathy without macular edema, bilateral: Secondary | ICD-10-CM | POA: Diagnosis not present

## 2015-10-21 DIAGNOSIS — Z6829 Body mass index (BMI) 29.0-29.9, adult: Secondary | ICD-10-CM | POA: Diagnosis not present

## 2015-10-21 DIAGNOSIS — E1021 Type 1 diabetes mellitus with diabetic nephropathy: Secondary | ICD-10-CM | POA: Diagnosis not present

## 2015-10-21 DIAGNOSIS — Z4681 Encounter for fitting and adjustment of insulin pump: Secondary | ICD-10-CM | POA: Diagnosis not present

## 2015-10-22 DIAGNOSIS — E109 Type 1 diabetes mellitus without complications: Secondary | ICD-10-CM | POA: Diagnosis not present

## 2015-12-29 DIAGNOSIS — E109 Type 1 diabetes mellitus without complications: Secondary | ICD-10-CM | POA: Diagnosis not present

## 2016-01-05 ENCOUNTER — Telehealth: Payer: Self-pay | Admitting: Neurology

## 2016-01-05 NOTE — Telephone Encounter (Signed)
I called patient back. She states that [facility] faxed [place] a supply form. I was able to advise her that we did receive that today and Dr. Rexene Alberts has signed in Dr. Edwena Felty absence. This form was faxed in just an hour ago. Patient voiced understanding. I asked her to call us back if she needed anything else.

## 2016-01-05 NOTE — Telephone Encounter (Signed)
Pt called in stating that she is waiting on rx for cpap supplies. Please call and advise [phone]

## 2016-01-11 DIAGNOSIS — G4733 Obstructive sleep apnea (adult) (pediatric): Secondary | ICD-10-CM | POA: Diagnosis not present

## 2016-01-20 DIAGNOSIS — Z4681 Encounter for fitting and adjustment of insulin pump: Secondary | ICD-10-CM | POA: Diagnosis not present

## 2016-01-20 DIAGNOSIS — E1029 Type 1 diabetes mellitus with other diabetic kidney complication: Secondary | ICD-10-CM | POA: Diagnosis not present

## 2016-01-20 DIAGNOSIS — Z23 Encounter for immunization: Secondary | ICD-10-CM | POA: Diagnosis not present

## 2016-01-20 DIAGNOSIS — N08 Glomerular disorders in diseases classified elsewhere: Secondary | ICD-10-CM | POA: Diagnosis not present

## 2016-01-20 DIAGNOSIS — E10319 Type 1 diabetes mellitus with unspecified diabetic retinopathy without macular edema: Secondary | ICD-10-CM | POA: Diagnosis not present

## 2016-01-24 DIAGNOSIS — E1021 Type 1 diabetes mellitus with diabetic nephropathy: Secondary | ICD-10-CM | POA: Diagnosis not present

## 2016-01-24 DIAGNOSIS — E109 Type 1 diabetes mellitus without complications: Secondary | ICD-10-CM | POA: Diagnosis not present

## 2016-01-25 DIAGNOSIS — E1021 Type 1 diabetes mellitus with diabetic nephropathy: Secondary | ICD-10-CM | POA: Diagnosis not present

## 2016-01-25 DIAGNOSIS — E109 Type 1 diabetes mellitus without complications: Secondary | ICD-10-CM | POA: Diagnosis not present

## 2016-02-18 DIAGNOSIS — Z4681 Encounter for fitting and adjustment of insulin pump: Secondary | ICD-10-CM | POA: Diagnosis not present

## 2016-02-18 DIAGNOSIS — E1021 Type 1 diabetes mellitus with diabetic nephropathy: Secondary | ICD-10-CM | POA: Diagnosis not present

## 2016-02-18 DIAGNOSIS — Z6829 Body mass index (BMI) 29.0-29.9, adult: Secondary | ICD-10-CM | POA: Diagnosis not present

## 2016-02-21 DIAGNOSIS — E784 Other hyperlipidemia: Secondary | ICD-10-CM | POA: Diagnosis not present

## 2016-02-21 DIAGNOSIS — E11319 Type 2 diabetes mellitus with unspecified diabetic retinopathy without macular edema: Secondary | ICD-10-CM | POA: Diagnosis not present

## 2016-02-21 DIAGNOSIS — E1029 Type 1 diabetes mellitus with other diabetic kidney complication: Secondary | ICD-10-CM | POA: Diagnosis not present

## 2016-02-21 DIAGNOSIS — N08 Glomerular disorders in diseases classified elsewhere: Secondary | ICD-10-CM | POA: Diagnosis not present

## 2016-03-16 ENCOUNTER — Encounter: Payer: Self-pay | Admitting: Gastroenterology

## 2016-03-22 ENCOUNTER — Encounter: Payer: Self-pay | Admitting: Internal Medicine

## 2016-03-30 ENCOUNTER — Ambulatory Visit: Payer: BLUE CROSS/BLUE SHIELD | Admitting: Neurology

## 2016-04-05 ENCOUNTER — Encounter: Payer: Self-pay | Admitting: Neurology

## 2016-04-05 ENCOUNTER — Ambulatory Visit (INDEPENDENT_AMBULATORY_CARE_PROVIDER_SITE_OTHER): Payer: BLUE CROSS/BLUE SHIELD | Admitting: Neurology

## 2016-04-05 VITALS — BP 108/60 | HR 80 | Resp 16 | Ht 63.0 in | Wt 164.0 lb

## 2016-04-05 DIAGNOSIS — G4733 Obstructive sleep apnea (adult) (pediatric): Secondary | ICD-10-CM | POA: Diagnosis not present

## 2016-04-05 DIAGNOSIS — Z9989 Dependence on other enabling machines and devices: Secondary | ICD-10-CM

## 2016-04-05 NOTE — Patient Instructions (Signed)
Diabetes Mellitus and Food It is important for you to manage your blood sugar (glucose) level. Your blood glucose level can be greatly affected by what you eat. Eating healthier foods in the appropriate amounts throughout the day at about the same time each day will help you control your blood glucose level. It can also help slow or prevent worsening of your diabetes mellitus. Healthy eating may even help you improve the level of your blood pressure and reach or maintain a healthy weight. General recommendations for healthful eating and cooking habits include:  Eating meals and snacks regularly. Avoid going long periods of time without eating to lose weight.  Eating a diet that consists mainly of plant-based foods, such as fruits, vegetables, nuts, legumes, and whole grains.  Using low-heat cooking methods, such as baking, instead of high-heat cooking methods, such as deep frying.  Work with your dietitian to make sure you understand how to use the Nutrition Facts information on food labels. How can food affect me? Carbohydrates Carbohydrates affect your blood glucose level more than any other type of food. Your dietitian will help you determine how many carbohydrates to eat at each meal and teach you how to count carbohydrates. Counting carbohydrates is important to keep your blood glucose at a healthy level, especially if you are using insulin or taking certain medicines for diabetes mellitus. Alcohol Alcohol can cause sudden decreases in blood glucose (hypoglycemia), especially if you use insulin or take certain medicines for diabetes mellitus. Hypoglycemia can be a life-threatening condition. Symptoms of hypoglycemia (sleepiness, dizziness, and disorientation) are similar to symptoms of having too much alcohol. If your health care provider has given you approval to drink alcohol, do so in moderation and use the following guidelines:  Women should not have more than one drink per day, and men  should not have more than two drinks per day. One drink is equal to: ? 12 oz of beer. ? 5 oz of wine. ? 1 oz of hard liquor.  Do not drink on an empty stomach.  Keep yourself hydrated. Have water, diet soda, or unsweetened iced tea.  Regular soda, juice, and other mixers might contain a lot of carbohydrates and should be counted.  What foods are not recommended? As you make food choices, it is important to remember that all foods are not the same. Some foods have fewer nutrients per serving than other foods, even though they might have the same number of calories or carbohydrates. It is difficult to get your body what it needs when you eat foods with fewer nutrients. Examples of foods that you should avoid that are high in calories and carbohydrates but low in nutrients include:  Trans fats (most processed foods list trans fats on the Nutrition Facts label).  Regular soda.  Juice.  Candy.  Sweets, such as cake, pie, doughnuts, and cookies.  Fried foods.  What foods can I eat? Eat nutrient-rich foods, which will nourish your body and keep you healthy. The food you should eat also will depend on several factors, including:  The calories you need.  The medicines you take.  Your weight.  Your blood glucose level.  Your blood pressure level.  Your cholesterol level.  You should eat a variety of foods, including:  Protein. ? Lean cuts of meat. ? Proteins low in saturated fats, such as fish, egg whites, and beans. Avoid processed meats.  Fruits and vegetables. ? Fruits and vegetables that may help control blood glucose levels, such as apples,   yams.  Dairy products.  Choose fat-free or low-fat dairy products, such as milk, yogurt, and cheese.  Grains, bread, pasta, and rice.  Choose whole grain products, such as multigrain bread, whole oats, and brown rice. These foods may help control blood pressure.  Fats.  Foods containing healthful fats, such as nuts,  avocado, olive oil, canola oil, and fish. Does everyone with diabetes mellitus have the same meal plan? Because every person with diabetes mellitus is different, there is not one meal plan that works for everyone. It is very important that you meet with a dietitian who will help you create a meal plan that is just right for you. This information is not intended to replace advice given to you by your health care provider. Make sure you discuss any questions you have with your health care provider. Document Released: 01/12/2005 Document Revised: 09/23/2015 Document Reviewed: 03/14/2013 Elsevier Interactive Patient Education  2017 [place] for Diabetes Mellitus, Adult Carbohydrate counting is a method for keeping track of how many carbohydrates you eat. Eating carbohydrates naturally increases the amount of sugar (glucose) in the blood. Counting how many carbohydrates you eat helps keep your blood glucose within normal limits, which helps you manage your diabetes (diabetes mellitus). It is important to know how many carbohydrates you can safely have in each meal. This is different for every person. A diet and nutrition specialist (registered dietitian) can help you make a meal plan and calculate how many carbohydrates you should have at each meal and snack. Carbohydrates are found in the following foods:  Grains, such as breads and cereals.  Dried beans and soy products.  Starchy vegetables, such as potatoes, peas, and corn.  Fruit and fruit juices.  Milk and yogurt.  Sweets and snack foods, such as cake, cookies, candy, chips, and soft drinks. How do I count carbohydrates? There are two ways to count carbohydrates in food. You can use either of the methods or a combination of both. Reading "Nutrition Facts" on packaged food  The "Nutrition Facts" list is included on the labels of almost all packaged foods and beverages in the [place] It includes:  The serving  size.  Information about nutrients in each serving, including the grams (g) of carbohydrate per serving. To use the "Nutrition Facts":  Decide how many servings you will have.  Multiply the number of servings by the number of carbohydrates per serving.  The resulting number is the total amount of carbohydrates that you will be having. Learning standard serving sizes of other foods  When you eat foods containing carbohydrates that are not packaged or do not include "Nutrition Facts" on the label, you need to measure the servings in order to count the amount of carbohydrates:  Measure the foods that you will eat with a food scale or measuring cup, if needed.  Decide how many standard-size servings you will eat.  Multiply the number of servings by 15. Most carbohydrate-rich foods have about 15 g of carbohydrates per serving.  For example, if you eat 8 oz (170 g) of strawberries, you will have eaten 2 servings and 30 g of carbohydrates (2 servings x 15 g = 30 g).  For foods that have more than one food mixed, such as soups and casseroles, you must count the carbohydrates in each food that is included. The following list contains standard serving sizes of common carbohydrate-rich foods. Each of these servings has about 15 g of carbohydrates:   hamburger bun or  [place]  muffin.   oz (15 mL) syrup.   oz (14 g) jelly.  1 slice of bread.  1 six-inch tortilla.  3 oz (85 g) cooked rice or pasta.  4 oz (113 g) cooked dried beans.  4 oz (113 g) starchy vegetable, such as peas, corn, or potatoes.  4 oz (113 g) hot cereal.  4 oz (113 g) mashed potatoes or  of a large baked potato.  4 oz (113 g) canned or frozen fruit.  4 oz (120 mL) fruit juice.  4-6 crackers.  6 chicken nuggets.  6 oz (170 g) unsweetened dry cereal.  6 oz (170 g) plain fat-free yogurt or yogurt sweetened with artificial sweeteners.  8 oz (240 mL) milk.  8 oz (170 g) fresh fruit or one small piece of  fruit.  24 oz (680 g) popped popcorn. Example of carbohydrate counting Sample meal  3 oz (85 g) chicken breast.  6 oz (170 g) brown rice.  4 oz (113 g) corn.  8 oz (240 mL) milk.  8 oz (170 g) strawberries with sugar-free whipped topping. Carbohydrate calculation 1. Identify the foods that contain carbohydrates:  Rice.  Corn.  Milk.  Strawberries. 2. Calculate how many servings you have of each food:  2 servings rice.  1 serving corn.  1 serving milk.  1 serving strawberries. 3. Multiply each number of servings by 15 g:  2 servings rice x 15 g = 30 g.  1 serving corn x 15 g = 15 g.  1 serving milk x 15 g = 15 g.  1 serving strawberries x 15 g = 15 g. 4. Add together all of the amounts to find the total grams of carbohydrates eaten:  30 g + 15 g + 15 g + 15 g = 75 g of carbohydrates total. This information is not intended to replace advice given to you by your health care provider. Make sure you discuss any questions you have with your health care provider. Document Released: 04/17/2005 Document Revised: 11/05/2015 Document Reviewed: 09/29/2015 Elsevier Interactive Patient Education  2017 [employer].

## 2016-04-05 NOTE — Progress Notes (Signed)
PATIENT: ALLISON LESIEUR DOB: 05-04-1955  REASON FOR VISIT: follow up- obstructive sleep apnea HISTORY FROM: patient  HISTORY OF PRESENT ILLNESS: Ms. Stinar is a 60 year old female with a history of obstructive sleep apnea. She returns today for a compliance download. She didn't bring her memory card however we are unable to receive any data. Patient reports that she does use her CPAP nightly. She typically goes to bed around 11 or 12 PM and arises between 7 8 AM. Her Epworth sleepiness score is was previously 9 and fatigue severity score is was previously 36. She denies any trouble falling asleep or staying asleep. She denies any new neurological symptoms. She returns today and he were able to gather her compliance data including 04/04/2016, yesterday, by wife. The patient's average AHI is 3.2, her CPAP is set at 10 cm water pressure and her average user time is 5 hours and 38 minutes. She has used the machine 29 out of 30 days with a compliance of 86.7% 4 hours. The needs to be no adjustments made. She endorsed the fatigue severity score 22 points and the Epworth sleepiness score at 9 points.Her weight has been stable.   Insulin pump user. Dr Forde Dandy, MD.  HISTORY 03/20/14: Ms. Pettinato is a 60 year old female with a history of obstructive sleep Apnea. She returns today for a 90 day compliance download. Patient did not bring her machine with her however she says that her DME company fax over her download. We have not received this. Patient states that she has used the machine nightly except for when she was sick recently. Her Epworth score is 9 points was previously 11 points. Her fatigue severity score is 36 was previously 39. Patient reports that she gets about 7 hours of sleep a night. She goes to bed around 11-12 and arises at 7-8 AM. She denies having trouble falling asleep or staying a sleep. States that the she does not have to get up during the night to urinate. Overall patient feels that her  sleepiness and fatigue has improved however she is unsure if it is due to the CPAP. Since the last visit the patient has had no new medical issues  HISTORY 09/17/13 [facility]): NERI VANNOSTRAND is a 60 y.o. female here as a revisit from Dr. Forde Dandy for a sleep consult. Mrs. Calamari is a Caucasian, right handed female with a 27 year history of insulin-dependent diabetic mellitus type I, diagnosed at age 43. She started CPAP use last year . She is followed by [facility], Richardean Canal. Her residual AHI is 2.00 - AHI during a sleep study was 17.8 per hour of sleep she is using a CPAP of 5 hours 40 minutes each night and heard compliance is 85% for the last 90 days this means that the patient has uses CPAP every day but not every day for 4 hours. The download was obtained on 07-02-13 at the height of tax season. CPAP is set at 10 cm water , The patient states that she still could take a nap of several hours each afternoon if she would find the time to do so. Sometimes she will break up after the nap ,will go to sleep again at 10 PM , falls asleep promptly . she usually sleeps now better through the night.Her chief complaint is forgetfulness, rather than sleepiness  REVIEW OF SYSTEMS: Out of a complete 14 system review of symptoms, the patient complains only of the following symptoms, and all other reviewed  systems are negative.  ALLERGIES: No Known Allergies  HOME MEDICATIONS: Outpatient Medications Prior to Visit  Medication Sig Dispense Refill  . aspirin 81 MG tablet Take 162 mg by mouth daily.      . Calcium Carbonate-Vitamin D (CALCIUM + D PO) Take 600 mg by mouth 2 (two) times daily.      Marland Kitchen JARDIANCE 10 MG TABS tablet Take 1 tablet by mouth daily.  4  . loratadine (CLARITIN) 10 MG tablet Take 10 mg by mouth daily as needed for allergies.    . Multiple Vitamins-Minerals (MULTIVITAMIN WITH MINERALS) tablet Take 1 tablet by mouth daily.      Marland Kitchen neomycin-polymyxin-hydrocortisone (CORTISPORIN) otic  solution Place 4 drops into the right ear 4 (four) times daily.  0  . NOVOLOG 100 UNIT/ML injection Inject into the skin as directed. Insulin pump    . PRISTIQ 50 MG 24 hr tablet Take 1 tablet by mouth Daily.    Marland Kitchen VYTORIN 10-20 MG per tablet Take 1 tablet by mouth Daily.     No facility-administered medications prior to visit.     PAST MEDICAL HISTORY: Past Medical History:  Diagnosis Date  . Adenomatous colon polyp   . Cataract   . Depression   . Diabetes mellitus    type I  on insulin pump  . Hyperlipidemia   . Insulin pump in place   . Sleep apnea, obstructive     with AHI of 13.8 , RDI of 17.9 -SPLIT study at [facility]  06-21-12-uses cpap    PAST SURGICAL HISTORY: Past Surgical History:  Procedure Laterality Date  . APPENDECTOMY  2007  . CARPAL TUNNEL RELEASE  2000   bilateral  . CATARACT EXTRACTION  2013   both  . CESAREAN SECTION  1989  . COLONOSCOPY    . DUPUYTREN CONTRACTURE RELEASE  11/16/2011   Procedure: DUPUYTREN CONTRACTURE RELEASE;  Surgeon: Cammie Sickle., MD;  Location: [facility];  Service: Orthopedics;  Laterality: Left;  . EYE SURGERY    . hands     trigger finger release/ multiple  . TRIGGER FINGER RELEASE  11/16/2011   Procedure: RELEASE TRIGGER FINGER/A-1 PULLEY;  Surgeon: Cammie Sickle., MD;  Location: [place];  Service: Orthopedics;  Laterality: Left;  Left hand dupuytrens and A-1 pulley release left thumb,left long and small fingers.  Marland Kitchen TRIGGER FINGER RELEASE Right 04/30/2014   Procedure: RIGHT LONG TRIGGER RELEASE;  Surgeon: Leanora Cover, MD;  Location: [place];  Service: Orthopedics;  Laterality: Right;  . WRIST SURGERY  01/2011    right 2012      [id number]    FAMILY HISTORY: Family History  Problem Relation Age of Onset  . Colon cancer Paternal Grandmother   . Tuberculosis Maternal Aunt   . Tuberculosis Maternal Grandmother     SOCIAL HISTORY: Social History   Social History  .  Marital status: Legally Separated    Spouse name: N/A  . Number of children: 1  . Years of education: BS   Occupational History  . CPA Cpa   Social History Main Topics  . Smoking status: Never Smoker  . Smokeless tobacco: Never Used  . Alcohol use 0.6 oz/week    1 Glasses of wine per week     Comment: occasionally  . Drug use: No  . Sexual activity: Not on file   Other Topics Concern  . Not on file   Social History Narrative   Patient is divorced  and lives at home with her son.   Patient is working full-time.   Patient has a BS degree.   Patient is right-handed.   Patient drinks three cups of coffee and occasionally tea.      PHYSICAL EXAM  Vitals:   04/05/16 1453  Resp: 16  Weight: 164 lb (74.4 kg)  Height: 5\' 3"  (1.6 m)   Body mass index is 29.05 kg/m.  Generalized: Well developed, in no acute distress   Neurological examination  Mentation: Alert oriented to time, place, history taking. Follows all commands speech and language fluent Cranial nerve II-XII: Pupils were equal round reactive to light. Extraocular movements were full, visual field were full on confrontational test. Facial sensation and strength were normal. Uvula tongue midline. Head turning and shoulder shrug  were normal and symmetric. Motor: The motor testing reveals 5 over 5 strength of all 4 extremities. Good symmetric motor tone is noted throughout.  Sensory: Sensory testing is intact to soft touch on all 4 extremities. No evidence of extinction is noted.  Coordination: Cerebellar testing reveals good finger-nose-finger and heel-to-shin bilaterally.  Gait and station: Gait is normal. Tandem gait is normal. Romberg is negative. No drift is seen.  Reflexes: Deep tendon reflexes are symmetric and normal bilaterally.   DIAGNOSTIC DATA (LABS, IMAGING, TESTING) - I reviewed patient records, labs, notes, testing and imaging myself where available.   ASSESSMENT AND PLAN 60 y.o. year old female  has  a past medical history of Adenomatous colon polyp; Cataract; Depression; Diabetes mellitus; Hyperlipidemia; Insulin pump in place; and Sleep apnea, obstructive. here with:  1. Obstructive sleep apnea on CPAP    2. Diabetes on insulin pump.   Unfortunately we were unable to obtain a download from her memory card. I have advised the patient that she needs to make an appointment with her DME company to have a download sent to our office. Her memory card may need to be replaced. Patient verbalized understanding. Encouraged the patient to continue using the CPAP nightly.  Advised patient that if her symptoms worsen or she develops any new symptoms she should let us know. She will follow-up in one year with me.    Ward Givens, MSN, NP-C 04/05/2016, 2:54 PM [facility] [address] [place], [place] [address] [phone]

## 2016-04-10 DIAGNOSIS — E1029 Type 1 diabetes mellitus with other diabetic kidney complication: Secondary | ICD-10-CM | POA: Diagnosis not present

## 2016-04-21 DIAGNOSIS — E1029 Type 1 diabetes mellitus with other diabetic kidney complication: Secondary | ICD-10-CM | POA: Diagnosis not present

## 2016-04-26 ENCOUNTER — Telehealth: Payer: Self-pay | Admitting: Neurology

## 2016-04-26 DIAGNOSIS — Z4681 Encounter for fitting and adjustment of insulin pump: Secondary | ICD-10-CM | POA: Diagnosis not present

## 2016-04-26 DIAGNOSIS — E1021 Type 1 diabetes mellitus with diabetic nephropathy: Secondary | ICD-10-CM | POA: Diagnosis not present

## 2016-04-26 DIAGNOSIS — Z6829 Body mass index (BMI) 29.0-29.9, adult: Secondary | ICD-10-CM | POA: Diagnosis not present

## 2016-04-26 NOTE — Telephone Encounter (Signed)
Pt called said she is needing supplies for CPAP, [facility] advised her they need page 1,(looks like it was sent on 01/05/16). She is wanting to get these before the end of the year. Pt would like for this to be sent this morning if possible, [facility] said they could get it done today if it was sent this morning.

## 2016-04-26 NOTE — Telephone Encounter (Signed)
Beverlee Nims- this was sent to me in error. Please see note, thank you!

## 2016-04-26 NOTE — Telephone Encounter (Signed)
I sent [facility] with [facility] a message about this patient to see what they need or if ppw can be refaxed to [place]. Any ppw from September would have been shredded at this point.

## 2016-05-08 NOTE — Telephone Encounter (Signed)
This is the reply that was sent back:  Shelby Dubin, RN; Leeroy Bock        Good morning Beverlee Nims,  I hope that you had a great Christmas!   I have pulled her OV note from 04/05/16; we would also need a pap download which per the note wasn't able to be obtained in office. Pt had told us that MD pulled download during this visit. We will get in touch with pt to discuss and see if we can arrange for a download to be done.  Pt is wanting supply reorder prior to end of year due to deductible. We are working to try to accommodate this request.   Thanks.   Angie

## 2016-05-25 DIAGNOSIS — Z4681 Encounter for fitting and adjustment of insulin pump: Secondary | ICD-10-CM | POA: Diagnosis not present

## 2016-05-25 DIAGNOSIS — E1021 Type 1 diabetes mellitus with diabetic nephropathy: Secondary | ICD-10-CM | POA: Diagnosis not present

## 2016-05-25 DIAGNOSIS — Z23 Encounter for immunization: Secondary | ICD-10-CM | POA: Diagnosis not present

## 2016-05-25 DIAGNOSIS — Z6829 Body mass index (BMI) 29.0-29.9, adult: Secondary | ICD-10-CM | POA: Diagnosis not present

## 2016-07-06 DIAGNOSIS — Z1389 Encounter for screening for other disorder: Secondary | ICD-10-CM | POA: Diagnosis not present

## 2016-07-06 DIAGNOSIS — N08 Glomerular disorders in diseases classified elsewhere: Secondary | ICD-10-CM | POA: Diagnosis not present

## 2016-07-06 DIAGNOSIS — E784 Other hyperlipidemia: Secondary | ICD-10-CM | POA: Diagnosis not present

## 2016-07-06 DIAGNOSIS — E1029 Type 1 diabetes mellitus with other diabetic kidney complication: Secondary | ICD-10-CM | POA: Diagnosis not present

## 2016-07-06 DIAGNOSIS — G4730 Sleep apnea, unspecified: Secondary | ICD-10-CM | POA: Diagnosis not present

## 2016-07-13 DIAGNOSIS — E1029 Type 1 diabetes mellitus with other diabetic kidney complication: Secondary | ICD-10-CM | POA: Diagnosis not present

## 2016-07-26 DIAGNOSIS — E1029 Type 1 diabetes mellitus with other diabetic kidney complication: Secondary | ICD-10-CM | POA: Diagnosis not present

## 2016-08-17 DIAGNOSIS — E1029 Type 1 diabetes mellitus with other diabetic kidney complication: Secondary | ICD-10-CM | POA: Diagnosis not present

## 2016-09-07 DIAGNOSIS — Z4681 Encounter for fitting and adjustment of insulin pump: Secondary | ICD-10-CM | POA: Diagnosis not present

## 2016-09-07 DIAGNOSIS — E1021 Type 1 diabetes mellitus with diabetic nephropathy: Secondary | ICD-10-CM | POA: Diagnosis not present

## 2016-09-13 DIAGNOSIS — E1021 Type 1 diabetes mellitus with diabetic nephropathy: Secondary | ICD-10-CM | POA: Diagnosis not present

## 2016-10-19 DIAGNOSIS — H43812 Vitreous degeneration, left eye: Secondary | ICD-10-CM | POA: Diagnosis not present

## 2016-10-19 DIAGNOSIS — E103393 Type 1 diabetes mellitus with moderate nonproliferative diabetic retinopathy without macular edema, bilateral: Secondary | ICD-10-CM | POA: Diagnosis not present

## 2016-10-19 DIAGNOSIS — E1029 Type 1 diabetes mellitus with other diabetic kidney complication: Secondary | ICD-10-CM | POA: Diagnosis not present

## 2016-10-19 DIAGNOSIS — H35043 Retinal micro-aneurysms, unspecified, bilateral: Secondary | ICD-10-CM | POA: Diagnosis not present

## 2016-10-27 DIAGNOSIS — G4733 Obstructive sleep apnea (adult) (pediatric): Secondary | ICD-10-CM | POA: Diagnosis not present

## 2016-11-27 DIAGNOSIS — E1021 Type 1 diabetes mellitus with diabetic nephropathy: Secondary | ICD-10-CM | POA: Diagnosis not present

## 2016-11-27 DIAGNOSIS — E784 Other hyperlipidemia: Secondary | ICD-10-CM | POA: Diagnosis not present

## 2016-11-27 DIAGNOSIS — E11319 Type 2 diabetes mellitus with unspecified diabetic retinopathy without macular edema: Secondary | ICD-10-CM | POA: Diagnosis not present

## 2016-11-27 DIAGNOSIS — E1029 Type 1 diabetes mellitus with other diabetic kidney complication: Secondary | ICD-10-CM | POA: Diagnosis not present

## 2016-12-11 ENCOUNTER — Encounter: Payer: Self-pay | Admitting: Internal Medicine

## 2017-01-08 DIAGNOSIS — E1029 Type 1 diabetes mellitus with other diabetic kidney complication: Secondary | ICD-10-CM | POA: Diagnosis not present

## 2017-01-25 DIAGNOSIS — E1029 Type 1 diabetes mellitus with other diabetic kidney complication: Secondary | ICD-10-CM | POA: Diagnosis not present

## 2017-01-31 ENCOUNTER — Telehealth: Payer: Self-pay | Admitting: *Deleted

## 2017-01-31 NOTE — Telephone Encounter (Signed)
Patient is for pv on 02/14/17 and Colonoscopy with Dr.Perry on 03/08/17. Patient has insulin pump, PCP Dr.South. Please contact PCP for insulin pump instructions. Thank you!! Marlana Latus

## 2017-02-02 ENCOUNTER — Telehealth: Payer: Self-pay

## 2017-02-02 NOTE — Telephone Encounter (Signed)
  Ha Shannahan [facility] 18-May-1955 [id number]  Dear Dr. Tilda Burrow, MD has scheduled the above patient for a colonoscopy and endoscopy at 8:00am on 03/08/2017.  Our records show that he/she is on insulin therapy via an insulin pump.  Our colonoscopy prep protocol requires that:  the patient must be on a clear liquid diet the entire day prior to the procedure date as well as the morning of the procedure  the patient must be NPO for 2 hours prior to the procedure   the patient must consume a PEG 3350 solution to prepare for the procedure.  Please advise [place] of any adjustments that need to be made to the patient's insulin pump therapy prior to the above procedure date.    Please route or fax back this completed form to me at [phone] .  If you have any question, please call me at [phone].  Thank you for your help with this matter.  Sincerely,

## 2017-02-02 NOTE — Telephone Encounter (Signed)
Routed insulin pump letter to Dr. Forde Dandy

## 2017-02-09 NOTE — Telephone Encounter (Signed)
  Emily Combs [facility] Aug 23, 1955 [id number]  Dear Dr. Tilda Burrow, MD has scheduled the above patient for a colonoscopy and endoscopy at 8:00am on 03/08/2017.  Our records show that he/she is on insulin therapy via an insulin pump.  Our colonoscopy prep protocol requires that:  the patient must be on a clear liquid diet the entire day prior to the procedure date as well as the morning of the procedure  the patient must be NPO for 2 hours prior to the procedure   the patient must consume a PEG 3350 solution to prepare for the procedure.  Please advise [place] of any adjustments that need to be made to the patient's insulin pump therapy prior to the above procedure date.    Please route or fax back this completed form to me at [phone] .  If you have any question, please call me at [phone].  Thank you for your help with this matter.  Sincerely,

## 2017-02-09 NOTE — Telephone Encounter (Signed)
Insulin pump letter sent for 2nd time

## 2017-02-14 NOTE — Telephone Encounter (Signed)
Response faxed back.  Per Dr. Forde Dandy, "stay on basal".  Gave pre visit the fax to attach to patient's chart and share with patient at her previsit appointment

## 2017-02-15 ENCOUNTER — Ambulatory Visit (AMBULATORY_SURGERY_CENTER): Payer: Self-pay

## 2017-02-15 VITALS — Ht 63.0 in | Wt 173.6 lb

## 2017-02-15 DIAGNOSIS — Z86010 Personal history of colonic polyps: Secondary | ICD-10-CM

## 2017-02-15 MED ORDER — NA SULFATE-K SULFATE-MG SULF 17.5-3.13-1.6 GM/177ML PO SOLN: 1.0000 | Freq: Once | ORAL | 0 refills | Status: AC

## 2017-02-15 NOTE — Progress Notes (Signed)
.  extr °

## 2017-02-22 ENCOUNTER — Encounter: Payer: Self-pay | Admitting: Internal Medicine

## 2017-02-28 ENCOUNTER — Encounter: Payer: Self-pay | Admitting: Internal Medicine

## 2017-02-28 DIAGNOSIS — Z23 Encounter for immunization: Secondary | ICD-10-CM | POA: Diagnosis not present

## 2017-02-28 DIAGNOSIS — E1021 Type 1 diabetes mellitus with diabetic nephropathy: Secondary | ICD-10-CM | POA: Diagnosis not present

## 2017-02-28 DIAGNOSIS — N08 Glomerular disorders in diseases classified elsewhere: Secondary | ICD-10-CM | POA: Diagnosis not present

## 2017-02-28 DIAGNOSIS — Z4681 Encounter for fitting and adjustment of insulin pump: Secondary | ICD-10-CM | POA: Diagnosis not present

## 2017-02-28 DIAGNOSIS — Z794 Long term (current) use of insulin: Secondary | ICD-10-CM | POA: Diagnosis not present

## 2017-03-01 ENCOUNTER — Telehealth: Payer: Self-pay | Admitting: Internal Medicine

## 2017-03-01 NOTE — Telephone Encounter (Signed)
Returned patients call. Patient was informed that we do not do Prior authorizations for preps. I did call in a pay no more than 50.00 coupon that would help with the cost. Patient was informed that the coupon went through and she could pick up her prep.   Riki Sheer, LPN (PV)

## 2017-03-02 ENCOUNTER — Telehealth: Payer: Self-pay | Admitting: Internal Medicine

## 2017-03-02 NOTE — Telephone Encounter (Signed)
Spoke with patient. Explained the reason that we do not do the prior auth. Patient states she just wanted to know why we didn't.

## 2017-03-08 ENCOUNTER — Ambulatory Visit (AMBULATORY_SURGERY_CENTER): Payer: BLUE CROSS/BLUE SHIELD | Admitting: Internal Medicine

## 2017-03-08 ENCOUNTER — Encounter: Payer: Self-pay | Admitting: Internal Medicine

## 2017-03-08 ENCOUNTER — Other Ambulatory Visit: Payer: Self-pay

## 2017-03-08 VITALS — BP 152/84 | HR 75 | Temp 96.4°F | Resp 17 | Ht 63.0 in | Wt 173.0 lb

## 2017-03-08 DIAGNOSIS — D123 Benign neoplasm of transverse colon: Secondary | ICD-10-CM | POA: Diagnosis not present

## 2017-03-08 DIAGNOSIS — D122 Benign neoplasm of ascending colon: Secondary | ICD-10-CM | POA: Diagnosis not present

## 2017-03-08 DIAGNOSIS — Z86010 Personal history of colonic polyps: Secondary | ICD-10-CM

## 2017-03-08 MED ORDER — SODIUM CHLORIDE 0.9 % IV SOLN: 500.0000 mL | INTRAVENOUS | Status: DC

## 2017-03-08 NOTE — Op Note (Signed)
Emily Combs Patient Name: Emily Combs Procedure Date: 03/08/2017 7:55 AM MRN: [id number] Endoscopist: Docia Chuck. Henrene Pastor , MD Age: 61 Referring MD:  Date of Birth: Dec 07, 1955 Gender: Female Account #: [id number] Procedure:                Colonoscopy, With cold snare polypectomy x 3 Indications:              High risk colon cancer surveillance: Personal                            history of adenoma (10 mm or greater in size), High                            risk colon cancer surveillance: Personal history of                            adenoma with villous component. Prior examinations                            2009 and 2013 Medicines:                Monitored Anesthesia Care Procedure:                Pre-Anesthesia Assessment:                           - Prior to the procedure, a History and Physical                            was performed, and patient medications and                            allergies were reviewed. The patient's tolerance of                            previous anesthesia was also reviewed. The risks                            and benefits of the procedure and the sedation                            options and risks were discussed with the patient.                            All questions were answered, and informed consent                            was obtained. Prior Anticoagulants: The patient has                            taken no previous anticoagulant or antiplatelet                            agents. ASA Grade Assessment: II - A patient with  mild systemic disease. After reviewing the risks                            and benefits, the patient was deemed in                            satisfactory condition to undergo the procedure.                           After obtaining informed consent, the colonoscope                            was passed under direct vision. Throughout the                            procedure, the  patient's blood pressure, pulse, and                            oxygen saturations were monitored continuously. The                            Colonoscope was introduced through the anus and                            advanced to the the cecum, identified by                            appendiceal orifice and ileocecal valve. The                            ileocecal valve, appendiceal orifice, and rectum                            were photographed. The quality of the bowel                            preparation was good. The colonoscopy was performed                            without difficulty. The patient tolerated the                            procedure well. The bowel preparation used was                            SUPREP. Scope In: 8:10:33 AM Scope Out: 8:31:59 AM Scope Withdrawal Time: 0 hours 18 minutes 38 seconds  Total Procedure Duration: 0 hours 21 minutes 26 seconds  Findings:                 Three polyps were found in the transverse colon and                            ascending colon. The polyps were 3 to 5 mm in size.  These polyps were removed with a cold snare.                            Resection and retrieval were complete.                           Internal hemorrhoids were found during                            retroflexion. The hemorrhoids were small.                           The exam was otherwise without abnormality on                            direct and retroflexion views. Complications:            No immediate complications. Estimated blood loss:                            None. Estimated Blood Loss:     Estimated blood loss: none. Impression:               - Three 3 to 5 mm polyps in the transverse colon                            and in the ascending colon, removed with a cold                            snare. Resected and retrieved.                           - Internal hemorrhoids.                           - The examination  was otherwise normal on direct                            and retroflexion views. Recommendation:           - Repeat colonoscopy in 3 - 5 years for                            surveillance.                           - Patient has a contact number available for                            emergencies. The signs and symptoms of potential                            delayed complications were discussed with the                            patient. Return to normal activities tomorrow.  Written discharge instructions were provided to the                            patient.                           - Resume previous diet.                           - Continue present medications.                           - Await pathology results. Docia Chuck. Henrene Pastor, MD 03/08/2017 8:37:52 AM This report has been signed electronically.

## 2017-03-08 NOTE — Progress Notes (Signed)
Called to room to assist during endoscopic procedure.  Patient ID and intended procedure confirmed with present staff. Received instructions for my participation in the procedure from the performing physician.  

## 2017-03-08 NOTE — Progress Notes (Signed)
Pt's states no medical or surgical changes since previsit or office visit. 

## 2017-03-08 NOTE — Progress Notes (Signed)
Report to PACU, RN, vss, BBS= Clear.  

## 2017-03-08 NOTE — Patient Instructions (Signed)
YOU HAD AN ENDOSCOPIC PROCEDURE TODAY AT [facility]:   Refer to the procedure report that was given to you for any specific questions about what was found during the examination.  If the procedure report does not answer your questions, please call your gastroenterologist to clarify.  If you requested that your care partner not be given the details of your procedure findings, then the procedure report has been included in a sealed envelope for you to review at your convenience later.  YOU SHOULD EXPECT: Some feelings of bloating in the abdomen. Passage of more gas than usual.  Walking can help get rid of the air that was put into your GI tract during the procedure and reduce the bloating. If you had a lower endoscopy (such as a colonoscopy or flexible sigmoidoscopy) you may notice spotting of blood in your stool or on the toilet paper. If you underwent a bowel prep for your procedure, you may not have a normal bowel movement for a few days.  Please Note:  You might notice some irritation and congestion in your nose or some drainage.  This is from the oxygen used during your procedure.  There is no need for concern and it should clear up in a day or so.  SYMPTOMS TO REPORT IMMEDIATELY:   Following lower endoscopy (colonoscopy or flexible sigmoidoscopy):  Excessive amounts of blood in the stool  Significant tenderness or worsening of abdominal pains  Swelling of the abdomen that is new, acute  Fever of 100F or higher  For urgent or emergent issues, a gastroenterologist can be reached at any hour by calling [phone].  DIET:  We do recommend a small meal at first, but then you may proceed to your regular diet.  Drink plenty of fluids but you should avoid alcoholic beverages for 24 hours.  ACTIVITY:  You should plan to take it easy for the rest of today and you should NOT DRIVE or use heavy machinery until tomorrow (because of the sedation medicines used during the test).     FOLLOW UP: Our staff will call the number listed on your records the next business day following your procedure to check on you and address any questions or concerns that you may have regarding the information given to you following your procedure. If we do not reach you, we will leave a message.  However, if you are feeling well and you are not experiencing any problems, there is no need to return our call.  We will assume that you have returned to your regular daily activities without incident.  If any biopsies were taken you will be contacted by phone or by letter within the next 1-3 weeks.  Please call us at [phone] if you have not heard about the biopsies in 3 weeks.   SIGNATURES/CONFIDENTIALITY: You and/or your care partner have signed paperwork which will be entered into your electronic medical record.  These signatures attest to the fact that that the information above on your After Visit Summary has been reviewed and is understood.  Full responsibility of the confidentiality of this discharge information lies with you and/or your care-partner.  Await pathology  Please read over handouts about polyps, hemorrhoids and high fiber diets  Continue your normal medications

## 2017-03-09 ENCOUNTER — Telehealth: Payer: Self-pay

## 2017-03-09 NOTE — Telephone Encounter (Signed)
  Follow up Call-  Call back number 03/08/2017  Post procedure Call Back phone  # [phone]  Permission to leave phone message Yes  Some recent data might be hidden     Patient questions:  Do you have a fever, pain , or abdominal swelling? No. Pain Score  0 *  Have you tolerated food without any problems? Yes.    Have you been able to return to your normal activities? Yes.    Do you have any questions about your discharge instructions: Diet   No. Medications  No. Follow up visit  No.  Do you have questions or concerns about your Care? No.  Actions: * If pain score is 4 or above: No action needed, pain <4.

## 2017-03-09 NOTE — Telephone Encounter (Signed)
Left message

## 2017-03-12 DIAGNOSIS — G4733 Obstructive sleep apnea (adult) (pediatric): Secondary | ICD-10-CM | POA: Diagnosis not present

## 2017-03-14 ENCOUNTER — Encounter: Payer: Self-pay | Admitting: Internal Medicine

## 2017-03-21 DIAGNOSIS — E1029 Type 1 diabetes mellitus with other diabetic kidney complication: Secondary | ICD-10-CM | POA: Diagnosis not present

## 2017-04-06 DIAGNOSIS — E1029 Type 1 diabetes mellitus with other diabetic kidney complication: Secondary | ICD-10-CM | POA: Diagnosis not present

## 2017-04-11 ENCOUNTER — Ambulatory Visit: Payer: BLUE CROSS/BLUE SHIELD | Admitting: Neurology

## 2017-04-17 ENCOUNTER — Encounter: Payer: Self-pay | Admitting: Neurology

## 2017-04-18 ENCOUNTER — Ambulatory Visit (INDEPENDENT_AMBULATORY_CARE_PROVIDER_SITE_OTHER): Payer: BLUE CROSS/BLUE SHIELD | Admitting: Neurology

## 2017-04-18 ENCOUNTER — Encounter: Payer: Self-pay | Admitting: Neurology

## 2017-04-18 VITALS — BP 123/74 | HR 91 | Ht 63.0 in | Wt 168.0 lb

## 2017-04-18 DIAGNOSIS — E109 Type 1 diabetes mellitus without complications: Secondary | ICD-10-CM | POA: Diagnosis not present

## 2017-04-18 DIAGNOSIS — Z9989 Dependence on other enabling machines and devices: Secondary | ICD-10-CM | POA: Diagnosis not present

## 2017-04-18 DIAGNOSIS — Z9641 Presence of insulin pump (external) (internal): Secondary | ICD-10-CM | POA: Diagnosis not present

## 2017-04-18 DIAGNOSIS — G4733 Obstructive sleep apnea (adult) (pediatric): Secondary | ICD-10-CM

## 2017-04-18 NOTE — Progress Notes (Signed)
PATIENT: Emily Combs DOB: Sep 28, 1955  REASON FOR VISIT: follow up- obstructive sleep apnea HISTORY FROM: patient  HISTORY OF PRESENT ILLNESS: Emily Combs is a 61 year old female with a history of obstructive sleep apnea. She returns today for a compliance download. She didn't bring her memory card however we are unable to receive any data. Patient reports that she does use her CPAP nightly. She typically goes to bed around 11 or 12 PM and arises between 7 8 AM. Her Epworth sleepiness score is was previously 9 and fatigue severity score is was previously 36. She denies any trouble falling asleep or staying asleep. She denies any new neurological symptoms. She returns today and he were able to gather her compliance data including 04/04/2016, yesterday, by wife. The patient's average AHI is 3.2, her CPAP is set at 10 cm water pressure and her average user time is 5 hours and 38 minutes.  She has used the machine 29 out of 30 days with a compliance of 86.7% 4 hours. The needs to be no adjustments made. She endorsed the fatigue severity score 22 points and the Epworth sleepiness score at 9 points. Her weight has been stable.   Insulin pump user. Dr Forde Dandy, MD.  HISTORY 03/20/14: Ms. Combs is a 61 year old female with a history of obstructive sleep Apnea. She returns today for a 90 day compliance download. Patient did not bring her machine with her however she says that her DME company fax over her download. We have not received this. Patient states that she has used the machine nightly except for when she was sick recently. Her Epworth score is 9 points was previously 11 points. Her fatigue severity score is 36 was previously 39. Patient reports that she gets about 7 hours of sleep a night. She goes to bed around 11-12 and arises at 7-8 AM. She denies having trouble falling asleep or staying a sleep. States that the she does not have to get up during the night to urinate. Overall patient feels that  her sleepiness and fatigue has improved however she is unsure if it is due to the CPAP. Since the last visit the patient has had no new medical issues  HISTORY 09/17/13 [facility]): Emily Combs is a 61 y.o. female here as a revisit from Dr. Forde Dandy for a sleep consult. 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 She started CPAP use last year . She is followed by [facility], Richardean Canal. Her residual AHI is 2.00 - AHI during a sleep study was 17.8 per hour of sleep she is using a CPAP of 5 hours 40 minutes each night and heard compliance is 85% for the last 90 days this means that the patient has uses CPAP every day but not every day for 4 hours. The download was obtained on 07-02-13 at the height of tax season. CPAP is set at 10 cm water , The patient states that she still could take a nap of several hours each afternoon if she would find the time to do so. Sometimes she will break up after the nap ,will go to sleep again at 10 PM , falls asleep promptly . she usually sleeps now better through the night.Her chief complaint is forgetfulness, rather than sleepiness.  Interval history from 18 April 2017.  I have the pleasure of meeting with Emily Combs, and she has been compliant CPAP  user.  She is using CPAP at 10 cm water pressure with an average residual AHI of 4.290% compliance average use of time 5 hours and 30 minutes.  She explains the reduced overall use of time with falling asleep on the couch before she goes finally to bed where her CPAP is located. She does not watch TV in her bedroom which is a good habit.    REVIEW OF SYSTEMS: Out of a complete 14 system review of symptoms, the patient complains only of the following symptoms, and all other reviewed systems are negative.  The patient endorsed the Epworth sleepiness score at 8 points, fatigue severity at 27 points and the geriatric depression  score at 2 out of 15 points. New insulin pump since 03-2016 auto mode. Diabetic since age 76 .  ALLERGIES: No Known Allergies  HOME MEDICATIONS: Outpatient Medications Prior to Visit  Medication Sig Dispense Refill  . aspirin 81 MG tablet Take 81 mg by mouth daily.     . Calcium Carbonate-Vitamin D (CALCIUM + D PO) Take 500 mg by mouth 2 (two) times daily.     . insulin aspart (FIASP FLEXTOUCH) 100 UNIT/ML FlexPen Inject 100 Units into the skin 3 (three) times daily with meals. Emily Combs is administered through insulin pump.    Marland Kitchen JARDIANCE 10 MG TABS tablet Take 1 tablet by mouth daily.  4  . Multiple Vitamins-Minerals (MULTIVITAMIN WITH MINERALS) tablet Take 1 tablet by mouth daily.      Marland Kitchen OVER THE COUNTER MEDICATION Biotin two tablets daily.    Marland Kitchen PRISTIQ 50 MG 24 hr tablet Take 1 tablet by mouth Daily.    Marland Kitchen VYTORIN 10-20 MG per tablet Take 1 tablet by mouth Daily.     No facility-administered medications prior to visit.     PAST MEDICAL HISTORY: Past Medical History:  Diagnosis Date  . Adenomatous colon polyp   . Allergy   . Anxiety   . Cataract   . Depression   . Diabetes mellitus    type I  on insulin pump  . GERD (gastroesophageal reflux disease)   . Hyperlipidemia   . Insulin pump in place   . Sleep apnea   . Sleep apnea, obstructive     with AHI of 13.8 , RDI of 17.9 -SPLIT study at [facility]  06-21-12-uses cpap    PAST SURGICAL HISTORY: Past Surgical History:  Procedure Laterality Date  . APPENDECTOMY  2007  . CARPAL TUNNEL RELEASE  2000   bilateral  . CATARACT EXTRACTION  2013   both  . CESAREAN SECTION  1989  . COLONOSCOPY    . DUPUYTREN CONTRACTURE RELEASE  11/16/2011   Procedure: DUPUYTREN CONTRACTURE RELEASE;  Surgeon: Cammie Sickle., MD;  Location: [place];  Service: Orthopedics;  Laterality: Left;  . EYE SURGERY    . hands     trigger finger release/ multiple  . TRIGGER FINGER RELEASE  11/16/2011   Procedure: RELEASE TRIGGER FINGER/A-1  PULLEY;  Surgeon: Cammie Sickle., MD;  Location: [place];  Service: Orthopedics;  Laterality: Left;  Left hand dupuytrens and A-1 pulley release left thumb,left long and small fingers.  Marland Kitchen TRIGGER FINGER RELEASE Right 04/30/2014   Procedure: RIGHT LONG TRIGGER RELEASE;  Surgeon: Leanora Cover, MD;  Location: [place];  Service: Orthopedics;  Laterality: Right;  . WRIST SURGERY  01/2011    right 2012      [id number]    FAMILY HISTORY: Family History  Problem Relation Age of Onset  . Colon cancer Paternal Grandmother   . Tuberculosis Maternal Aunt   . Tuberculosis Maternal Grandmother   . Stomach cancer Paternal Aunt   . Esophageal cancer Neg Hx   . Pancreatic cancer Neg Hx   . Rectal cancer Neg Hx     SOCIAL HISTORY: Social History   Socioeconomic History  . Marital status: Legally Separated    Spouse name: Not on file  . Number of children: 1  . Years of education: BS  . Highest education level: Not on file  Social Needs  . Financial resource strain: Not on file  . Food insecurity - worry: Not on file  . Food insecurity - inability: Not on file  . Transportation needs - medical: Not on file  . Transportation needs - non-medical: Not on file  Occupational History  . Occupation: Tour manager  Tobacco Use  . Smoking status: Never Smoker  . Smokeless tobacco: Never Used  Substance and Sexual Activity  . Alcohol use: Yes    Alcohol/week: 0.6 oz    Types: 1 Glasses of wine per week    Comment: occasionally  . Drug use: No  . Sexual activity: Not on file  Other Topics Concern  . Not on file  Social History Narrative   Patient is divorced and lives at home with her son.   Patient is working full-time.   Patient has a BS degree.   Patient is right-handed.   Patient drinks three cups of coffee and occasionally tea.      PHYSICAL EXAM  Vitals:   04/18/17 0924  BP: 123/74  Pulse: 91  Weight: 168 lb (76.2 kg)  Height: 5'  3" (1.6 m)   Body mass index is 29.76 kg/m.  Generalized: Well developed, in no acute distress .  Neck circumference 16 inches, short neck.  Mallampati is grade 4 I can see the top of the uvula but not the lower tip.  Neurological examination  Mentation: Alert oriented to time, place, history taking. Follows all commands speech and language fluent Cranial nerve ; Pupils were equal round . Extraocular movements were full, visual field were full on confrontational test. Facial sensation and strength were normal. Uvula tongue midline. Head turning and shoulder shrug  were normal and symmetric. Motor: Muscle bulk and strength, symmetric in all 4 extremities.   Patient does not report sensory deficits to fingers and feet. Reflexes: Deep tendon reflexes are symmetric  (  Not attenuated ) bilaterally.   DIAGNOSTIC DATA (LABS, IMAGING, TESTING) - I reviewed patient records, labs, notes, testing and imaging myself where available.  download CPAP>   ASSESSMENT AND PLAN 61 y.o. year old female  has a past medical history of Adenomatous colon polyp, Allergy, Anxiety, Cataract, Depression, Diabetes mellitus, GERD (gastroesophageal reflux disease), Hyperlipidemia, Insulin pump in place, Sleep apnea, and Sleep apnea, obstructive. here with:  1. Obstructive sleep apnea on CPAP, mild apnea in 2014, cpap at 10 cm water.    2. Diabetes type 1  on insulin pump.  Obese .   Mrs. Maltese has questions if she still needs her CPAP machine.  4 years ago when she was diagnosed her apnea was mild her AHI was low but her RDI which indicates snoring was moderate.  I think if she could succeed with some weight loss the machine may become obsolete. If she would like to be reevaluated I would not invite her into the sleep lab but  do it with a home sleep test just to have a new baseline.  We can do that home sleep test in the beginning of the new year.  Since she has a new deductible starting with a new calendar year we  will hold off on the home sleep test for now, but she will make a more concerted effort to lose weight.  This will support apnea treatment and certainly also benefit snoring.  Rv in 12 month with NP    Larey Seat, MD  04/18/2017, 9:46 AM [facility] [address], [place], [place] [address] [phone]

## 2017-04-19 DIAGNOSIS — G4730 Sleep apnea, unspecified: Secondary | ICD-10-CM | POA: Diagnosis not present

## 2017-04-19 DIAGNOSIS — K635 Polyp of colon: Secondary | ICD-10-CM | POA: Diagnosis not present

## 2017-04-19 DIAGNOSIS — N08 Glomerular disorders in diseases classified elsewhere: Secondary | ICD-10-CM | POA: Diagnosis not present

## 2017-04-19 DIAGNOSIS — E7849 Other hyperlipidemia: Secondary | ICD-10-CM | POA: Diagnosis not present

## 2017-04-19 DIAGNOSIS — E1021 Type 1 diabetes mellitus with diabetic nephropathy: Secondary | ICD-10-CM | POA: Diagnosis not present

## 2017-04-19 DIAGNOSIS — Z6830 Body mass index (BMI) 30.0-30.9, adult: Secondary | ICD-10-CM | POA: Diagnosis not present

## 2017-04-19 DIAGNOSIS — E10319 Type 1 diabetes mellitus with unspecified diabetic retinopathy without macular edema: Secondary | ICD-10-CM | POA: Diagnosis not present

## 2017-04-19 DIAGNOSIS — K219 Gastro-esophageal reflux disease without esophagitis: Secondary | ICD-10-CM | POA: Diagnosis not present

## 2017-04-19 DIAGNOSIS — E1029 Type 1 diabetes mellitus with other diabetic kidney complication: Secondary | ICD-10-CM | POA: Diagnosis not present

## 2017-04-19 DIAGNOSIS — F330 Major depressive disorder, recurrent, mild: Secondary | ICD-10-CM | POA: Diagnosis not present

## 2017-04-20 DIAGNOSIS — E1029 Type 1 diabetes mellitus with other diabetic kidney complication: Secondary | ICD-10-CM | POA: Diagnosis not present

## 2017-04-20 DIAGNOSIS — Z794 Long term (current) use of insulin: Secondary | ICD-10-CM | POA: Diagnosis not present

## 2017-04-22 DIAGNOSIS — E1029 Type 1 diabetes mellitus with other diabetic kidney complication: Secondary | ICD-10-CM | POA: Diagnosis not present

## 2017-04-28 DIAGNOSIS — G4733 Obstructive sleep apnea (adult) (pediatric): Secondary | ICD-10-CM | POA: Diagnosis not present

## 2017-05-25 DIAGNOSIS — Z794 Long term (current) use of insulin: Secondary | ICD-10-CM | POA: Diagnosis not present

## 2017-05-25 DIAGNOSIS — E1029 Type 1 diabetes mellitus with other diabetic kidney complication: Secondary | ICD-10-CM | POA: Diagnosis not present

## 2017-05-30 DIAGNOSIS — N951 Menopausal and female climacteric states: Secondary | ICD-10-CM | POA: Diagnosis not present

## 2017-05-30 DIAGNOSIS — Z794 Long term (current) use of insulin: Secondary | ICD-10-CM | POA: Diagnosis not present

## 2017-05-30 DIAGNOSIS — E1021 Type 1 diabetes mellitus with diabetic nephropathy: Secondary | ICD-10-CM | POA: Diagnosis not present

## 2017-05-30 DIAGNOSIS — Z4681 Encounter for fitting and adjustment of insulin pump: Secondary | ICD-10-CM | POA: Diagnosis not present

## 2017-06-06 ENCOUNTER — Other Ambulatory Visit: Payer: Self-pay | Admitting: Endocrinology

## 2017-06-06 DIAGNOSIS — Z1231 Encounter for screening mammogram for malignant neoplasm of breast: Secondary | ICD-10-CM

## 2017-06-14 DIAGNOSIS — Z13820 Encounter for screening for osteoporosis: Secondary | ICD-10-CM | POA: Diagnosis not present

## 2017-06-25 ENCOUNTER — Ambulatory Visit
Admission: RE | Admit: 2017-06-25 | Discharge: 2017-06-25 | Disposition: A | Discharge status: Home / Self Care | Payer: BLUE CROSS/BLUE SHIELD | Source: Ambulatory Visit | Attending: Endocrinology | Admitting: Endocrinology

## 2017-06-25 DIAGNOSIS — Z1231 Encounter for screening mammogram for malignant neoplasm of breast: Secondary | ICD-10-CM | POA: Diagnosis not present

## 2017-08-06 DIAGNOSIS — E1029 Type 1 diabetes mellitus with other diabetic kidney complication: Secondary | ICD-10-CM | POA: Diagnosis not present

## 2017-08-06 DIAGNOSIS — Z794 Long term (current) use of insulin: Secondary | ICD-10-CM | POA: Diagnosis not present

## 2017-08-23 DIAGNOSIS — E1021 Type 1 diabetes mellitus with diabetic nephropathy: Secondary | ICD-10-CM | POA: Diagnosis not present

## 2017-08-23 DIAGNOSIS — Z1239 Encounter for other screening for malignant neoplasm of breast: Secondary | ICD-10-CM | POA: Diagnosis not present

## 2017-08-23 DIAGNOSIS — R82998 Other abnormal findings in urine: Secondary | ICD-10-CM | POA: Diagnosis not present

## 2017-08-23 DIAGNOSIS — Z124 Encounter for screening for malignant neoplasm of cervix: Secondary | ICD-10-CM | POA: Diagnosis not present

## 2017-08-23 DIAGNOSIS — Z0000 Encounter for general adult medical examination without abnormal findings: Secondary | ICD-10-CM | POA: Diagnosis not present

## 2017-08-23 DIAGNOSIS — Z1151 Encounter for screening for human papillomavirus (HPV): Secondary | ICD-10-CM | POA: Diagnosis not present

## 2017-09-03 DIAGNOSIS — E1029 Type 1 diabetes mellitus with other diabetic kidney complication: Secondary | ICD-10-CM | POA: Diagnosis not present

## 2017-09-03 DIAGNOSIS — Z794 Long term (current) use of insulin: Secondary | ICD-10-CM | POA: Diagnosis not present

## 2017-09-03 DIAGNOSIS — Z6830 Body mass index (BMI) 30.0-30.9, adult: Secondary | ICD-10-CM | POA: Diagnosis not present

## 2017-09-03 DIAGNOSIS — G4730 Sleep apnea, unspecified: Secondary | ICD-10-CM | POA: Diagnosis not present

## 2017-09-03 DIAGNOSIS — D126 Benign neoplasm of colon, unspecified: Secondary | ICD-10-CM | POA: Diagnosis not present

## 2017-09-03 DIAGNOSIS — Z0000 Encounter for general adult medical examination without abnormal findings: Secondary | ICD-10-CM | POA: Diagnosis not present

## 2017-09-27 DIAGNOSIS — E103393 Type 1 diabetes mellitus with moderate nonproliferative diabetic retinopathy without macular edema, bilateral: Secondary | ICD-10-CM | POA: Diagnosis not present

## 2017-09-27 DIAGNOSIS — Z961 Presence of intraocular lens: Secondary | ICD-10-CM | POA: Diagnosis not present

## 2017-09-27 DIAGNOSIS — H43812 Vitreous degeneration, left eye: Secondary | ICD-10-CM | POA: Diagnosis not present

## 2017-09-27 DIAGNOSIS — H35043 Retinal micro-aneurysms, unspecified, bilateral: Secondary | ICD-10-CM | POA: Diagnosis not present

## 2017-10-11 DIAGNOSIS — E1029 Type 1 diabetes mellitus with other diabetic kidney complication: Secondary | ICD-10-CM | POA: Diagnosis not present

## 2017-10-18 DIAGNOSIS — E1022 Type 1 diabetes mellitus with diabetic chronic kidney disease: Secondary | ICD-10-CM | POA: Diagnosis not present

## 2017-10-18 DIAGNOSIS — E1021 Type 1 diabetes mellitus with diabetic nephropathy: Secondary | ICD-10-CM | POA: Diagnosis not present

## 2017-10-18 DIAGNOSIS — Z794 Long term (current) use of insulin: Secondary | ICD-10-CM | POA: Diagnosis not present

## 2017-10-18 DIAGNOSIS — Z4681 Encounter for fitting and adjustment of insulin pump: Secondary | ICD-10-CM | POA: Diagnosis not present

## 2017-11-30 DIAGNOSIS — E1029 Type 1 diabetes mellitus with other diabetic kidney complication: Secondary | ICD-10-CM | POA: Diagnosis not present

## 2017-11-30 DIAGNOSIS — Z794 Long term (current) use of insulin: Secondary | ICD-10-CM | POA: Diagnosis not present

## 2017-12-21 DIAGNOSIS — E1029 Type 1 diabetes mellitus with other diabetic kidney complication: Secondary | ICD-10-CM | POA: Diagnosis not present

## 2018-01-02 DIAGNOSIS — Z23 Encounter for immunization: Secondary | ICD-10-CM | POA: Diagnosis not present

## 2018-01-02 DIAGNOSIS — E7849 Other hyperlipidemia: Secondary | ICD-10-CM | POA: Diagnosis not present

## 2018-01-02 DIAGNOSIS — N08 Glomerular disorders in diseases classified elsewhere: Secondary | ICD-10-CM | POA: Diagnosis not present

## 2018-01-02 DIAGNOSIS — E1022 Type 1 diabetes mellitus with diabetic chronic kidney disease: Secondary | ICD-10-CM | POA: Diagnosis not present

## 2018-01-04 DIAGNOSIS — G4733 Obstructive sleep apnea (adult) (pediatric): Secondary | ICD-10-CM | POA: Diagnosis not present

## 2018-02-14 DIAGNOSIS — Z794 Long term (current) use of insulin: Secondary | ICD-10-CM | POA: Diagnosis not present

## 2018-02-14 DIAGNOSIS — E1022 Type 1 diabetes mellitus with diabetic chronic kidney disease: Secondary | ICD-10-CM | POA: Diagnosis not present

## 2018-02-14 DIAGNOSIS — Z4681 Encounter for fitting and adjustment of insulin pump: Secondary | ICD-10-CM | POA: Diagnosis not present

## 2018-02-14 DIAGNOSIS — N08 Glomerular disorders in diseases classified elsewhere: Secondary | ICD-10-CM | POA: Diagnosis not present

## 2018-02-28 DIAGNOSIS — E1029 Type 1 diabetes mellitus with other diabetic kidney complication: Secondary | ICD-10-CM | POA: Diagnosis not present

## 2018-03-25 DIAGNOSIS — Z794 Long term (current) use of insulin: Secondary | ICD-10-CM | POA: Diagnosis not present

## 2018-03-25 DIAGNOSIS — E1029 Type 1 diabetes mellitus with other diabetic kidney complication: Secondary | ICD-10-CM | POA: Diagnosis not present

## 2018-04-22 DIAGNOSIS — E1029 Type 1 diabetes mellitus with other diabetic kidney complication: Secondary | ICD-10-CM | POA: Diagnosis not present

## 2018-04-25 ENCOUNTER — Encounter: Payer: Self-pay | Admitting: Adult Health

## 2018-04-29 ENCOUNTER — Ambulatory Visit (INDEPENDENT_AMBULATORY_CARE_PROVIDER_SITE_OTHER): Payer: BLUE CROSS/BLUE SHIELD | Admitting: Adult Health

## 2018-04-29 ENCOUNTER — Encounter: Payer: Self-pay | Admitting: Adult Health

## 2018-04-29 VITALS — BP 115/69 | HR 87 | Ht 63.0 in | Wt 172.0 lb

## 2018-04-29 DIAGNOSIS — Z9989 Dependence on other enabling machines and devices: Secondary | ICD-10-CM | POA: Diagnosis not present

## 2018-04-29 DIAGNOSIS — G4733 Obstructive sleep apnea (adult) (pediatric): Secondary | ICD-10-CM

## 2018-04-29 NOTE — Progress Notes (Signed)
PATIENT: Emily Combs [facility] DOB: 05/13/1955  REASON FOR VISIT: follow up HISTORY FROM: patient  HISTORY OF PRESENT ILLNESS: Today 04/29/18  Emily Combs is a 62 year old female with a history of obstructive sleep apnea on CPAP.  Her CPAP download indicates that she use her machine 29 out of 30 days for compliance of 96.7%.  She used her machine greater than 4 hours 93.3% of the time.  Her residual AHI is 3.6 on 10 cm of water.  She does not have a significant leak.  The patient feels that the machine continues to work well for her.  She has no new complaints today.  She returns today for evaluation.  HISTORY 18 April 2017.  I have the pleasure of meeting with Emily Combs, and she has been compliant CPAP user.  She is using CPAP at 10 cm water pressure with an average residual AHI of 4.290% compliance average use of time 5 hours and 30 minutes.  She explains the reduced overall use of time with falling asleep on the couch before she goes finally to bed where her CPAP is located. She does not watch TV in her bedroom which is a good habit.  REVIEW OF SYSTEMS: Out of a complete 14 system review of symptoms, the patient complains only of the following symptoms, and all other reviewed systems are negative.  See HPI  ALLERGIES: No Known Allergies  HOME MEDICATIONS: Outpatient Medications Prior to Visit  Medication Sig Dispense Refill  . aspirin 81 MG tablet Take 81 mg by mouth daily.     . Calcium Carbonate-Vitamin D (CALCIUM + D PO) Take 500 mg by mouth 2 (two) times daily.     . insulin aspart (FIASP FLEXTOUCH) 100 UNIT/ML FlexPen Inject into the skin 3 (three) times daily with meals. Emily Combs is administered through insulin pump.     Marland Kitchen JARDIANCE 10 MG TABS tablet Take 1 tablet by mouth daily.  4  . Multiple Vitamins-Minerals (MULTIVITAMIN WITH MINERALS) tablet Take 1 tablet by mouth daily.      Marland Kitchen OVER THE COUNTER MEDICATION Biotin two tablets daily.    Marland Kitchen PRISTIQ 50 MG 24 hr tablet Take 1  tablet by mouth Daily.    Marland Kitchen VYTORIN 10-20 MG per tablet Take 1 tablet by mouth Daily.     No facility-administered medications prior to visit.     PAST MEDICAL HISTORY: Past Medical History:  Diagnosis Date  . Adenomatous colon polyp   . Allergy   . Anxiety   . Cataract   . Depression   . Diabetes mellitus    type I  on insulin pump  . GERD (gastroesophageal reflux disease)   . Hyperlipidemia   . Insulin pump in place   . Sleep apnea   . Sleep apnea, obstructive     with AHI of 13.8 , RDI of 17.9 -SPLIT study at [facility]  06-21-12-uses cpap    PAST SURGICAL HISTORY: Past Surgical History:  Procedure Laterality Date  . APPENDECTOMY  2007  . CARPAL TUNNEL RELEASE  2000   bilateral  . CATARACT EXTRACTION  2013   both  . CESAREAN SECTION  1989  . COLONOSCOPY    . DUPUYTREN CONTRACTURE RELEASE  11/16/2011   Procedure: DUPUYTREN CONTRACTURE RELEASE;  Surgeon: Emily Combs., MD;  Location: [place];  Service: Orthopedics;  Laterality: Left;  . EYE SURGERY    . hands     trigger finger release/ multiple  . TRIGGER FINGER RELEASE  11/16/2011   Procedure: RELEASE TRIGGER FINGER/A-1 PULLEY;  Surgeon: Emily Combs., MD;  Location: [place];  Service: Orthopedics;  Laterality: Left;  Left hand dupuytrens and A-1 pulley release left thumb,left long and small fingers.  Marland Kitchen TRIGGER FINGER RELEASE Right 04/30/2014   Procedure: RIGHT LONG TRIGGER RELEASE;  Surgeon: Emily Cover, MD;  Location: [place];  Service: Orthopedics;  Laterality: Right;  . WRIST SURGERY  01/2011    right 2012      [id number]    FAMILY HISTORY: Family History  Problem Relation Age of Onset  . Colon cancer Paternal Grandmother   . Tuberculosis Maternal Aunt   . Tuberculosis Maternal Grandmother   . Stomach cancer Paternal Aunt   . Esophageal cancer Neg Hx   . Pancreatic cancer Neg Hx   . Rectal cancer Neg Hx     SOCIAL HISTORY: Social History    Socioeconomic History  . Marital status: Legally Separated    Spouse name: Not on file  . Number of children: 1  . Years of education: BS  . Highest education level: Not on file  Occupational History  . Occupation: Tour manager  Social Needs  . Financial resource strain: Not on file  . Food insecurity:    Worry: Not on file    Inability: Not on file  . Transportation needs:    Medical: Not on file    Non-medical: Not on file  Tobacco Use  . Smoking status: Never Smoker  . Smokeless tobacco: Never Used  Substance and Sexual Activity  . Alcohol use: Yes    Alcohol/week: 1.0 standard drinks    Types: 1 Glasses of wine per week    Comment: occasionally  . Drug use: No  . Sexual activity: Not on file  Lifestyle  . Physical activity:    Days per week: Not on file    Minutes per session: Not on file  . Stress: Not on file  Relationships  . Social connections:    Talks on phone: Not on file    Gets together: Not on file    Attends religious service: Not on file    Active member of club or organization: Not on file    Attends meetings of clubs or organizations: Not on file    Relationship status: Not on file  . Intimate partner violence:    Fear of current or ex partner: Not on file    Emotionally abused: Not on file    Physically abused: Not on file    Forced sexual activity: Not on file  Other Topics Concern  . Not on file  Social History Narrative   Patient is divorced and lives at home with her son.   Patient is working full-time.   Patient has a BS degree.   Patient is right-handed.   Patient drinks three cups of coffee and occasionally tea.      PHYSICAL EXAM  Vitals:   04/29/18 1500  BP: 115/69  Pulse: 87  Weight: 172 lb (78 kg)  Height: 5\' 3"  (1.6 m)   Body mass index is 30.47 kg/m.  Generalized: Well developed, in no acute distress   Neurological examination  Mentation: Alert oriented to time, place, history taking. Follows all  commands speech and language fluent Cranial nerve II-XII: Pupils were equal round reactive to light. Extraocular movements were full, visual field were full on confrontational test. Facial sensation and strength were normal. Uvula tongue  midline. Head turning and shoulder shrug  were normal and symmetric. Motor: The motor testing reveals 5 over 5 strength of all 4 extremities. Good symmetric motor tone is noted throughout.  Sensory: Sensory testing is intact to soft touch on all 4 extremities. No evidence of extinction is noted.  Gait and station: Gait is normal.   DIAGNOSTIC DATA (LABS, IMAGING, TESTING) - I reviewed patient records, labs, notes, testing and imaging myself where available.  Lab Results  Component Value Date   HGB 13.9 04/30/2014   HCT 41.0 04/30/2014      Component Value Date/Time   NA 140 04/30/2014 1106   K 4.0 04/30/2014 1106   CL 99 04/30/2014 1106   CO2 27 11/14/2011 1453   GLUCOSE 166 (H) 04/30/2014 1106   BUN 17 04/30/2014 1106   CREATININE 0.60 04/30/2014 1106   CALCIUM 9.8 11/14/2011 1453   GFRNONAA >90 11/14/2011 1453   GFRAA >90 11/14/2011 1453      ASSESSMENT AND PLAN 62 y.o. year old female  has a past medical history of Adenomatous colon polyp, Allergy, Anxiety, Cataract, Depression, Diabetes mellitus, GERD (gastroesophageal reflux disease), Hyperlipidemia, Insulin pump in place, Sleep apnea, and Sleep apnea, obstructive. here with:  1.  Obstructive sleep apnea on CPAP  The patient CPAP download shows excellent compliance and good treatment of her apnea.  She is encouraged to continue using the CPAP nightly and greater than 4 hours each night.  She is advised that if her symptoms worsen or she develops new symptoms she should let us know.  She will follow-up in 1 year or sooner if needed.   I spent 15 minutes with the patient. 50% of this time was spent reviewing her CPAP download   Ward Givens, MSN, NP-C 04/29/2018, 3:09 PM [facility] [address], [place], [place] [address] [phone]

## 2018-04-29 NOTE — Patient Instructions (Addendum)

## 2018-05-02 DIAGNOSIS — G4733 Obstructive sleep apnea (adult) (pediatric): Secondary | ICD-10-CM | POA: Diagnosis not present

## 2018-05-17 DIAGNOSIS — E1029 Type 1 diabetes mellitus with other diabetic kidney complication: Secondary | ICD-10-CM | POA: Diagnosis not present

## 2018-05-22 DIAGNOSIS — M859 Disorder of bone density and structure, unspecified: Secondary | ICD-10-CM | POA: Diagnosis not present

## 2018-05-22 DIAGNOSIS — E10319 Type 1 diabetes mellitus with unspecified diabetic retinopathy without macular edema: Secondary | ICD-10-CM | POA: Diagnosis not present

## 2018-05-22 DIAGNOSIS — D126 Benign neoplasm of colon, unspecified: Secondary | ICD-10-CM | POA: Diagnosis not present

## 2018-05-22 DIAGNOSIS — E1022 Type 1 diabetes mellitus with diabetic chronic kidney disease: Secondary | ICD-10-CM | POA: Diagnosis not present

## 2018-07-03 DIAGNOSIS — E1022 Type 1 diabetes mellitus with diabetic chronic kidney disease: Secondary | ICD-10-CM | POA: Diagnosis not present

## 2018-07-08 DIAGNOSIS — B370 Candidal stomatitis: Secondary | ICD-10-CM | POA: Diagnosis not present

## 2018-07-15 DIAGNOSIS — Z794 Long term (current) use of insulin: Secondary | ICD-10-CM | POA: Diagnosis not present

## 2018-07-15 DIAGNOSIS — E1029 Type 1 diabetes mellitus with other diabetic kidney complication: Secondary | ICD-10-CM | POA: Diagnosis not present

## 2018-07-18 DIAGNOSIS — E1029 Type 1 diabetes mellitus with other diabetic kidney complication: Secondary | ICD-10-CM | POA: Diagnosis not present

## 2018-09-17 DIAGNOSIS — E1029 Type 1 diabetes mellitus with other diabetic kidney complication: Secondary | ICD-10-CM | POA: Diagnosis not present

## 2018-09-26 DIAGNOSIS — H35043 Retinal micro-aneurysms, unspecified, bilateral: Secondary | ICD-10-CM | POA: Diagnosis not present

## 2018-09-26 DIAGNOSIS — H43812 Vitreous degeneration, left eye: Secondary | ICD-10-CM | POA: Diagnosis not present

## 2018-09-26 DIAGNOSIS — E103393 Type 1 diabetes mellitus with moderate nonproliferative diabetic retinopathy without macular edema, bilateral: Secondary | ICD-10-CM | POA: Diagnosis not present

## 2018-09-26 DIAGNOSIS — Z961 Presence of intraocular lens: Secondary | ICD-10-CM | POA: Diagnosis not present

## 2018-09-27 DIAGNOSIS — E1022 Type 1 diabetes mellitus with diabetic chronic kidney disease: Secondary | ICD-10-CM | POA: Diagnosis not present

## 2018-09-27 DIAGNOSIS — D126 Benign neoplasm of colon, unspecified: Secondary | ICD-10-CM | POA: Diagnosis not present

## 2018-09-27 DIAGNOSIS — N08 Glomerular disorders in diseases classified elsewhere: Secondary | ICD-10-CM | POA: Diagnosis not present

## 2018-09-27 DIAGNOSIS — E785 Hyperlipidemia, unspecified: Secondary | ICD-10-CM | POA: Diagnosis not present

## 2018-09-30 DIAGNOSIS — E1022 Type 1 diabetes mellitus with diabetic chronic kidney disease: Secondary | ICD-10-CM | POA: Diagnosis not present

## 2018-09-30 DIAGNOSIS — E7849 Other hyperlipidemia: Secondary | ICD-10-CM | POA: Diagnosis not present

## 2018-10-10 DIAGNOSIS — Z794 Long term (current) use of insulin: Secondary | ICD-10-CM | POA: Diagnosis not present

## 2018-10-10 DIAGNOSIS — E1029 Type 1 diabetes mellitus with other diabetic kidney complication: Secondary | ICD-10-CM | POA: Diagnosis not present

## 2018-10-11 DIAGNOSIS — G4733 Obstructive sleep apnea (adult) (pediatric): Secondary | ICD-10-CM | POA: Diagnosis not present

## 2018-10-18 DIAGNOSIS — G4733 Obstructive sleep apnea (adult) (pediatric): Secondary | ICD-10-CM | POA: Diagnosis not present

## 2018-11-07 DIAGNOSIS — E1021 Type 1 diabetes mellitus with diabetic nephropathy: Secondary | ICD-10-CM | POA: Diagnosis not present

## 2018-11-07 DIAGNOSIS — Z4681 Encounter for fitting and adjustment of insulin pump: Secondary | ICD-10-CM | POA: Diagnosis not present

## 2018-11-07 DIAGNOSIS — Z794 Long term (current) use of insulin: Secondary | ICD-10-CM | POA: Diagnosis not present

## 2018-11-07 DIAGNOSIS — E1022 Type 1 diabetes mellitus with diabetic chronic kidney disease: Secondary | ICD-10-CM | POA: Diagnosis not present

## 2018-11-28 DIAGNOSIS — R51 Headache: Secondary | ICD-10-CM | POA: Diagnosis not present

## 2018-11-28 DIAGNOSIS — E1022 Type 1 diabetes mellitus with diabetic chronic kidney disease: Secondary | ICD-10-CM | POA: Diagnosis not present

## 2018-11-28 DIAGNOSIS — R0789 Other chest pain: Secondary | ICD-10-CM | POA: Diagnosis not present

## 2018-11-28 DIAGNOSIS — Z20818 Contact with and (suspected) exposure to other bacterial communicable diseases: Secondary | ICD-10-CM | POA: Diagnosis not present

## 2018-11-28 DIAGNOSIS — R112 Nausea with vomiting, unspecified: Secondary | ICD-10-CM | POA: Diagnosis not present

## 2018-12-06 DIAGNOSIS — E1029 Type 1 diabetes mellitus with other diabetic kidney complication: Secondary | ICD-10-CM | POA: Diagnosis not present

## 2019-01-22 ENCOUNTER — Other Ambulatory Visit: Payer: Self-pay | Admitting: Endocrinology

## 2019-01-22 DIAGNOSIS — Z1231 Encounter for screening mammogram for malignant neoplasm of breast: Secondary | ICD-10-CM

## 2019-01-24 DIAGNOSIS — E7849 Other hyperlipidemia: Secondary | ICD-10-CM | POA: Diagnosis not present

## 2019-01-24 DIAGNOSIS — E1022 Type 1 diabetes mellitus with diabetic chronic kidney disease: Secondary | ICD-10-CM | POA: Diagnosis not present

## 2019-01-24 DIAGNOSIS — Z0000 Encounter for general adult medical examination without abnormal findings: Secondary | ICD-10-CM | POA: Diagnosis not present

## 2019-01-24 DIAGNOSIS — Z23 Encounter for immunization: Secondary | ICD-10-CM | POA: Diagnosis not present

## 2019-01-27 DIAGNOSIS — E103299 Type 1 diabetes mellitus with mild nonproliferative diabetic retinopathy without macular edema, unspecified eye: Secondary | ICD-10-CM | POA: Diagnosis not present

## 2019-01-27 DIAGNOSIS — Z1331 Encounter for screening for depression: Secondary | ICD-10-CM | POA: Diagnosis not present

## 2019-01-27 DIAGNOSIS — R82998 Other abnormal findings in urine: Secondary | ICD-10-CM | POA: Diagnosis not present

## 2019-01-27 DIAGNOSIS — Z0000 Encounter for general adult medical examination without abnormal findings: Secondary | ICD-10-CM | POA: Diagnosis not present

## 2019-01-27 DIAGNOSIS — E785 Hyperlipidemia, unspecified: Secondary | ICD-10-CM | POA: Diagnosis not present

## 2019-01-27 DIAGNOSIS — E10319 Type 1 diabetes mellitus with unspecified diabetic retinopathy without macular edema: Secondary | ICD-10-CM | POA: Diagnosis not present

## 2019-01-27 DIAGNOSIS — G4730 Sleep apnea, unspecified: Secondary | ICD-10-CM | POA: Diagnosis not present

## 2019-01-27 DIAGNOSIS — E1022 Type 1 diabetes mellitus with diabetic chronic kidney disease: Secondary | ICD-10-CM | POA: Diagnosis not present

## 2019-01-29 DIAGNOSIS — E1029 Type 1 diabetes mellitus with other diabetic kidney complication: Secondary | ICD-10-CM | POA: Diagnosis not present

## 2019-01-29 DIAGNOSIS — Z794 Long term (current) use of insulin: Secondary | ICD-10-CM | POA: Diagnosis not present

## 2019-02-07 ENCOUNTER — Encounter: Payer: Self-pay | Admitting: Adult Health

## 2019-02-07 DIAGNOSIS — Z1212 Encounter for screening for malignant neoplasm of rectum: Secondary | ICD-10-CM | POA: Diagnosis not present

## 2019-02-07 DIAGNOSIS — G4733 Obstructive sleep apnea (adult) (pediatric): Secondary | ICD-10-CM

## 2019-02-10 NOTE — Telephone Encounter (Signed)
Order written. Please send to DME

## 2019-02-20 ENCOUNTER — Telehealth: Payer: Self-pay | Admitting: Adult Health

## 2019-02-20 ENCOUNTER — Encounter: Payer: Self-pay | Admitting: Adult Health

## 2019-02-20 NOTE — Telephone Encounter (Signed)
Pt has called to inform she has reached out to the DME and they have not received a fax from NP [place], [place] states she was given their intake fax# of [phone] and their general fax# of [phone].  Please resend the order

## 2019-02-27 DIAGNOSIS — G4733 Obstructive sleep apnea (adult) (pediatric): Secondary | ICD-10-CM | POA: Diagnosis not present

## 2019-03-07 ENCOUNTER — Ambulatory Visit
Admission: RE | Admit: 2019-03-07 | Discharge: 2019-03-07 | Disposition: A | Discharge status: Home / Self Care | Payer: BC Managed Care – PPO | Source: Ambulatory Visit | Attending: Endocrinology | Admitting: Endocrinology

## 2019-03-07 ENCOUNTER — Other Ambulatory Visit: Payer: Self-pay

## 2019-03-07 DIAGNOSIS — Z1231 Encounter for screening mammogram for malignant neoplasm of breast: Secondary | ICD-10-CM | POA: Diagnosis not present

## 2019-03-12 DIAGNOSIS — E1022 Type 1 diabetes mellitus with diabetic chronic kidney disease: Secondary | ICD-10-CM | POA: Diagnosis not present

## 2019-03-12 DIAGNOSIS — Z4681 Encounter for fitting and adjustment of insulin pump: Secondary | ICD-10-CM | POA: Diagnosis not present

## 2019-03-12 DIAGNOSIS — Z794 Long term (current) use of insulin: Secondary | ICD-10-CM | POA: Diagnosis not present

## 2019-03-13 DIAGNOSIS — G4733 Obstructive sleep apnea (adult) (pediatric): Secondary | ICD-10-CM | POA: Diagnosis not present

## 2019-03-30 DIAGNOSIS — G4733 Obstructive sleep apnea (adult) (pediatric): Secondary | ICD-10-CM | POA: Diagnosis not present

## 2019-03-31 DIAGNOSIS — E1029 Type 1 diabetes mellitus with other diabetic kidney complication: Secondary | ICD-10-CM | POA: Diagnosis not present

## 2019-04-08 DIAGNOSIS — E1029 Type 1 diabetes mellitus with other diabetic kidney complication: Secondary | ICD-10-CM | POA: Diagnosis not present

## 2019-04-23 DIAGNOSIS — E1029 Type 1 diabetes mellitus with other diabetic kidney complication: Secondary | ICD-10-CM | POA: Diagnosis not present

## 2019-04-29 DIAGNOSIS — G4733 Obstructive sleep apnea (adult) (pediatric): Secondary | ICD-10-CM | POA: Diagnosis not present

## 2019-05-05 ENCOUNTER — Telehealth (INDEPENDENT_AMBULATORY_CARE_PROVIDER_SITE_OTHER): Payer: BLUE CROSS/BLUE SHIELD | Admitting: Adult Health

## 2019-05-05 DIAGNOSIS — G4733 Obstructive sleep apnea (adult) (pediatric): Secondary | ICD-10-CM

## 2019-05-05 DIAGNOSIS — Z9989 Dependence on other enabling machines and devices: Secondary | ICD-10-CM

## 2019-05-05 NOTE — Addendum Note (Signed)
Addended by: Trudie Buckler on: 05/05/2019 03:22 PM   Modules accepted: Level of Service

## 2019-05-05 NOTE — Progress Notes (Signed)
  [facility] [address] [place]. [place] [address] [phone]     Virtual Visit via Telephone Note  I connected with Emily Combs on 05/05/19 at  3:00 PM EST by telephone located remotely at [facility] and verified that I am speaking with the correct person using two identifiers who reports being located at  home.    Visit scheduled by RN. She discussed the limitations, risks, security and privacy concerns of performing an evaluation and management service by telephone and the availability of in person appointments. I also discussed with the patient that there may be a patient responsible charge related to this service. The patient expressed understanding and agreed to proceed. See telephone note for consent and additional scheduling information.    History of Present Illness:  Emily Combs is a 64 y.o. female who has been followed in this office for obstructive sleep apnea on CPAP.  She was originally scheduled for an office visit but due to COVID-19 she was switched to a virtual visit but we were unable to connect with the camera so she was switched to a telephone visit   Ms. Cothran is a 64 year old female with a history of obstructive sleep apnea on CPAP.  Her download indicates that she use her machine 30 out of 30 days for compliance of 100%.  She used her machine greater than 4 hours 29 out of 30 days for compliance of 98%.  On average she uses her machine 6 hours and 12 minutes.  Her residual AHI is 1.4 on 10 cm of water with EPR of 1.  Her leak in the 95th percentile is 5.4 L/min.  She returns today for an evaluation.    Observations/Objective:  Generalized: Well developed, in no acute distress   Neurological examination  Mentation: Alert oriented to time, place, history taking. Follows all commands speech and language fluent  Assessment and Plan:  1.  Obstructive sleep apnea on CPAP  CPAP download shows excellent compliance  and good treatment of her apnea.  She is encouraged to continue using CPAP nightly.  Advised that if her symptoms worsen or she develops new symptoms she should let us know.  She will follow-up in 1 year or sooner if needed.  Follow Up Instructions:    Follow-up in 1 year   I discussed the assessment and treatment plan with the patient.  The patient was provided an opportunity to ask questions and all were answered to their satisfaction. The patient agreed with the plan and verbalized an understanding of the instructions.   I provided 10 minutes of non-face-to-face time during this encounter.    Ward Givens NP-C  [facility] [address] [place], [place] [address]  Phone [phone] Fax [phone]

## 2019-05-15 DIAGNOSIS — E669 Obesity, unspecified: Secondary | ICD-10-CM | POA: Diagnosis not present

## 2019-05-15 DIAGNOSIS — M8580 Other specified disorders of bone density and structure, unspecified site: Secondary | ICD-10-CM | POA: Diagnosis not present

## 2019-05-15 DIAGNOSIS — E1022 Type 1 diabetes mellitus with diabetic chronic kidney disease: Secondary | ICD-10-CM | POA: Diagnosis not present

## 2019-05-15 DIAGNOSIS — N08 Glomerular disorders in diseases classified elsewhere: Secondary | ICD-10-CM | POA: Diagnosis not present

## 2019-05-29 DIAGNOSIS — G4733 Obstructive sleep apnea (adult) (pediatric): Secondary | ICD-10-CM | POA: Diagnosis not present

## 2019-05-30 DIAGNOSIS — G4733 Obstructive sleep apnea (adult) (pediatric): Secondary | ICD-10-CM | POA: Diagnosis not present

## 2019-06-20 DIAGNOSIS — Z794 Long term (current) use of insulin: Secondary | ICD-10-CM | POA: Diagnosis not present

## 2019-06-20 DIAGNOSIS — E1029 Type 1 diabetes mellitus with other diabetic kidney complication: Secondary | ICD-10-CM | POA: Diagnosis not present

## 2019-06-25 DIAGNOSIS — E1022 Type 1 diabetes mellitus with diabetic chronic kidney disease: Secondary | ICD-10-CM | POA: Diagnosis not present

## 2019-06-26 DIAGNOSIS — E1022 Type 1 diabetes mellitus with diabetic chronic kidney disease: Secondary | ICD-10-CM | POA: Diagnosis not present

## 2019-06-26 DIAGNOSIS — Z4681 Encounter for fitting and adjustment of insulin pump: Secondary | ICD-10-CM | POA: Diagnosis not present

## 2019-06-26 DIAGNOSIS — Z794 Long term (current) use of insulin: Secondary | ICD-10-CM | POA: Diagnosis not present

## 2019-06-26 DIAGNOSIS — W5503XA Scratched by cat, initial encounter: Secondary | ICD-10-CM | POA: Diagnosis not present

## 2019-06-29 DIAGNOSIS — G4733 Obstructive sleep apnea (adult) (pediatric): Secondary | ICD-10-CM | POA: Diagnosis not present

## 2019-07-28 DIAGNOSIS — E1029 Type 1 diabetes mellitus with other diabetic kidney complication: Secondary | ICD-10-CM | POA: Diagnosis not present

## 2019-08-27 DIAGNOSIS — G4733 Obstructive sleep apnea (adult) (pediatric): Secondary | ICD-10-CM | POA: Diagnosis not present

## 2019-09-18 DIAGNOSIS — E1022 Type 1 diabetes mellitus with diabetic chronic kidney disease: Secondary | ICD-10-CM | POA: Diagnosis not present

## 2019-09-18 DIAGNOSIS — E669 Obesity, unspecified: Secondary | ICD-10-CM | POA: Diagnosis not present

## 2019-09-18 DIAGNOSIS — M859 Disorder of bone density and structure, unspecified: Secondary | ICD-10-CM | POA: Diagnosis not present

## 2019-09-18 DIAGNOSIS — E1029 Type 1 diabetes mellitus with other diabetic kidney complication: Secondary | ICD-10-CM | POA: Diagnosis not present

## 2019-09-22 DIAGNOSIS — E1029 Type 1 diabetes mellitus with other diabetic kidney complication: Secondary | ICD-10-CM | POA: Diagnosis not present

## 2019-09-22 DIAGNOSIS — Z794 Long term (current) use of insulin: Secondary | ICD-10-CM | POA: Diagnosis not present

## 2019-10-09 ENCOUNTER — Encounter (INDEPENDENT_AMBULATORY_CARE_PROVIDER_SITE_OTHER): Payer: BC Managed Care – PPO | Admitting: Ophthalmology

## 2019-10-16 ENCOUNTER — Ambulatory Visit (INDEPENDENT_AMBULATORY_CARE_PROVIDER_SITE_OTHER): Payer: BC Managed Care – PPO | Admitting: Ophthalmology

## 2019-10-16 ENCOUNTER — Encounter (INDEPENDENT_AMBULATORY_CARE_PROVIDER_SITE_OTHER): Payer: Self-pay | Admitting: Ophthalmology

## 2019-10-16 ENCOUNTER — Other Ambulatory Visit: Payer: Self-pay

## 2019-10-16 DIAGNOSIS — G4733 Obstructive sleep apnea (adult) (pediatric): Secondary | ICD-10-CM | POA: Diagnosis not present

## 2019-10-16 DIAGNOSIS — Z9989 Dependence on other enabling machines and devices: Secondary | ICD-10-CM

## 2019-10-16 DIAGNOSIS — E109 Type 1 diabetes mellitus without complications: Secondary | ICD-10-CM | POA: Diagnosis not present

## 2019-10-16 DIAGNOSIS — Z9641 Presence of insulin pump (external) (internal): Secondary | ICD-10-CM | POA: Diagnosis not present

## 2019-10-16 DIAGNOSIS — E103293 Type 1 diabetes mellitus with mild nonproliferative diabetic retinopathy without macular edema, bilateral: Secondary | ICD-10-CM

## 2019-10-16 NOTE — Assessment & Plan Note (Signed)
Stay on CPAP to maximize oxygenation to the retina and to the CNS.

## 2019-10-16 NOTE — Progress Notes (Signed)
10/16/2019     CHIEF COMPLAINT Patient presents for Retina Follow Up   HISTORY OF PRESENT ILLNESS: Emily Combs is a 64 y.o. female who presents to the clinic today for:   HPI    Retina Follow Up    Patient presents with  Diabetic Retinopathy.  In both eyes.  Duration of 1 year.  Since onset it is stable.          Comments    1 year follow up - FP OU Patient denies change in vision and overall has no complaints. LBS 135 A1C 6.9       Last edited by Gerda Diss on 10/16/2019  3:14 PM. (History)      Referring physician: Reynold Bowen, MD [place],  [place] [address]  HISTORICAL INFORMATION:   Selected notes from the [facility]: No current outpatient medications on file. (Ophthalmic Drugs)   No current facility-administered medications for this visit. (Ophthalmic Drugs)   Current Outpatient Medications (Other)  Medication Sig   aspirin 81 MG tablet Take 81 mg by mouth daily.    Calcium Carbonate-Vitamin D (CALCIUM + D PO) Take 500 mg by mouth 2 (two) times daily.    insulin aspart (FIASP FLEXTOUCH) 100 UNIT/ML FlexPen Inject into the skin 3 (three) times daily with meals. [name] is administered through insulin pump.    JARDIANCE 10 MG TABS tablet Take 1 tablet by mouth daily.   Multiple Vitamins-Minerals (MULTIVITAMIN WITH MINERALS) tablet Take 1 tablet by mouth daily.     OVER THE COUNTER MEDICATION Biotin two tablets daily.   PRISTIQ 50 MG 24 hr tablet Take 1 tablet by mouth Daily.   VYTORIN 10-20 MG per tablet Take 1 tablet by mouth Daily.   No current facility-administered medications for this visit. (Other)      REVIEW OF SYSTEMS:    ALLERGIES No Known Allergies  PAST MEDICAL HISTORY Past Medical History:  Diagnosis Date   Adenomatous colon polyp    Allergy    Anxiety    Cataract    Depression    Diabetes mellitus    type I  on insulin pump   GERD (gastroesophageal  reflux disease)    Hyperlipidemia    Insulin pump in place    Sleep apnea    Sleep apnea, obstructive     with AHI of 13.8 , RDI of 17.9 -SPLIT study at [facility]  06-21-12-uses cpap   Past Surgical History:  Procedure Laterality Date   APPENDECTOMY  2007   CARPAL TUNNEL RELEASE  2000   bilateral   CATARACT EXTRACTION  2013   both   [place]   COLONOSCOPY     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   TRIGGER FINGER RELEASE Right 04/30/2014   Procedure: RIGHT LONG TRIGGER RELEASE;  Surgeon: Leanora Cover, MD;  Location: [facility];  Service: Orthopedics;  Laterality: Right;  WRIST SURGERY  01/2011    right 2012      [id number]    FAMILY HISTORY Family History  Problem Relation Age of Onset   Colon cancer Paternal Grandmother    Tuberculosis Maternal Aunt    Tuberculosis Maternal Grandmother    Stomach cancer Paternal Aunt    Esophageal cancer Neg Hx    Pancreatic cancer Neg Hx    Rectal cancer Neg Hx     SOCIAL HISTORY Social History   Tobacco Use   Smoking status: Never Smoker   Smokeless tobacco: Never Used  Substance Use Topics   Alcohol use: Yes    Alcohol/week: 1.0 standard drink    Types: 1 Glasses of wine per week    Comment: occasionally   Drug use: No         OPHTHALMIC EXAM:  Base Eye Exam    Visual Acuity (Snellen - Linear)      Right Left   Dist [place] 20/20 20/20       Tonometry (Tonopen, 3:16 PM)       Right Left   Pressure 17 15       Pupils      Pupils Dark Light Shape React APD   Right PERRL 4 3 Round Brisk None   Left PERRL 4 3 Round Brisk None       Visual Fields (Counting fingers)      Left Right    Full Full       Extraocular Movement      Right Left    Full Full       Neuro/Psych    Oriented x3: Yes   Mood/Affect: Normal       Dilation    Both eyes: 1.0% Mydriacyl, 2.5% Phenylephrine @ 3:16 PM        Slit Lamp and Fundus Exam    External Exam      Right Left   External Normal Normal       Slit Lamp Exam      Right Left   Lids/Lashes Normal Normal   Conjunctiva/Sclera White and quiet White and quiet   Cornea Clear Clear   Anterior Chamber Deep and quiet Deep and quiet   Iris Round and reactive Round and reactive   Lens Posterior chamber intraocular lens Posterior chamber intraocular lens   Anterior Vitreous Normal Normal       Fundus Exam      Right Left   Posterior Vitreous Normal Normal   Disc Normal Normal   C/D Ratio 0.45 0.45   Macula Normal Normal   Vessels NPDR- Mild NPDR- Mild   Periphery Normal Normal          IMAGING AND PROCEDURES  Imaging and Procedures for 10/16/19  Color Fundus Photography Optos - OU - Both Eyes       Right Eye Progression has been stable. Disc findings include normal observations. Macula : normal observations. Vessels : (Mild NPDR, type 1 diabetes mellitus).   Left Eye Progression has been stable. Disc findings include normal observations. Macula : normal observations. Periphery : (Mild NPDR, type 1 diabetes mellitus).                 ASSESSMENT/PLAN:  OSA on CPAP Stay on CPAP to maximize oxygenation to the retina and to the CNS.      ICD-10-CM   1. Mild nonproliferative diabetic retinopathy of both eyes without macular edema associated with type 1 diabetes mellitus ([place])  F63.8466 Color  Fundus Photography Optos - OU - Both Eyes  2. Diabetes mellitus type 1, uncomplicated, on long term  insulin pump (HCC)  E10.9    Z96.41   3. OSA on CPAP  G47.33    Z99.89     1.  2.  3.  Ophthalmic Meds Ordered this visit:  No orders of the defined types were placed in this encounter.      Return in about 1 year (around 10/15/2020) for DILATE OU, COLOR FP.  There are no Patient Instructions on file for this visit.   Explained the diagnoses, plan, and follow up with the patient and they expressed understanding.  Patient expressed understanding of the importance of proper follow up care.   Clent Demark Cheney Gosch M.D. Diseases & Surgery of the Retina and Vitreous [facility] [place] 10/16/19     Abbreviations: M myopia (nearsighted); A astigmatism; H hyperopia (farsighted); P presbyopia; Mrx spectacle prescription;  CTL contact lenses; OD right eye; OS left eye; OU both eyes  XT exotropia; ET esotropia; PEK punctate epithelial keratitis; PEE punctate epithelial erosions; DES dry eye syndrome; MGD meibomian gland dysfunction; ATs artificial tears; PFAT's preservative free artificial tears; [place] nuclear sclerotic cataract; PSC posterior subcapsular cataract; ERM epi-retinal membrane; PVD posterior vitreous detachment; RD retinal detachment; DM diabetes mellitus; DR diabetic retinopathy; NPDR non-proliferative diabetic retinopathy; PDR proliferative diabetic retinopathy; CSME clinically significant macular edema; DME diabetic macular edema; dbh dot blot hemorrhages; CWS cotton wool spot; POAG primary open angle glaucoma; C/D cup-to-disc ratio; HVF humphrey visual field; GVF goldmann visual field; OCT optical coherence tomography; IOP intraocular pressure; BRVO Branch retinal vein occlusion; CRVO central retinal vein occlusion; CRAO central retinal artery occlusion; BRAO branch retinal artery occlusion; RT retinal tear; SB scleral buckle; PPV pars plana vitrectomy; VH Vitreous hemorrhage; PRP panretinal laser photocoagulation; IVK intravitreal kenalog; VMT vitreomacular traction; MH  Macular hole;  NVD neovascularization of the disc; NVE neovascularization elsewhere; AREDS age related eye disease study; ARMD age related macular degeneration; POAG primary open angle glaucoma; EBMD epithelial/anterior basement membrane dystrophy; ACIOL anterior chamber intraocular lens; IOL intraocular lens; PCIOL posterior chamber intraocular lens; Phaco/IOL phacoemulsification with intraocular lens placement; [place] photorefractive keratectomy; LASIK laser assisted in situ keratomileusis; HTN hypertension; DM diabetes mellitus; COPD chronic obstructive pulmonary disease

## 2019-10-17 DIAGNOSIS — L918 Other hypertrophic disorders of the skin: Secondary | ICD-10-CM | POA: Diagnosis not present

## 2019-10-17 DIAGNOSIS — L700 Acne vulgaris: Secondary | ICD-10-CM | POA: Diagnosis not present

## 2019-10-17 DIAGNOSIS — D225 Melanocytic nevi of trunk: Secondary | ICD-10-CM | POA: Diagnosis not present

## 2019-10-17 DIAGNOSIS — L821 Other seborrheic keratosis: Secondary | ICD-10-CM | POA: Diagnosis not present

## 2019-11-13 DIAGNOSIS — Z794 Long term (current) use of insulin: Secondary | ICD-10-CM | POA: Diagnosis not present

## 2019-11-13 DIAGNOSIS — E1022 Type 1 diabetes mellitus with diabetic chronic kidney disease: Secondary | ICD-10-CM | POA: Diagnosis not present

## 2019-11-13 DIAGNOSIS — Z4681 Encounter for fitting and adjustment of insulin pump: Secondary | ICD-10-CM | POA: Diagnosis not present

## 2019-11-25 DIAGNOSIS — G4733 Obstructive sleep apnea (adult) (pediatric): Secondary | ICD-10-CM | POA: Diagnosis not present

## 2019-11-25 DIAGNOSIS — E1029 Type 1 diabetes mellitus with other diabetic kidney complication: Secondary | ICD-10-CM | POA: Diagnosis not present

## 2020-01-15 DIAGNOSIS — Z961 Presence of intraocular lens: Secondary | ICD-10-CM | POA: Diagnosis not present

## 2020-01-15 DIAGNOSIS — H04123 Dry eye syndrome of bilateral lacrimal glands: Secondary | ICD-10-CM | POA: Diagnosis not present

## 2020-01-15 DIAGNOSIS — E113293 Type 2 diabetes mellitus with mild nonproliferative diabetic retinopathy without macular edema, bilateral: Secondary | ICD-10-CM | POA: Diagnosis not present

## 2020-01-21 DIAGNOSIS — E7849 Other hyperlipidemia: Secondary | ICD-10-CM | POA: Diagnosis not present

## 2020-01-21 DIAGNOSIS — M859 Disorder of bone density and structure, unspecified: Secondary | ICD-10-CM | POA: Diagnosis not present

## 2020-01-21 DIAGNOSIS — E1021 Type 1 diabetes mellitus with diabetic nephropathy: Secondary | ICD-10-CM | POA: Diagnosis not present

## 2020-01-21 DIAGNOSIS — Z23 Encounter for immunization: Secondary | ICD-10-CM | POA: Diagnosis not present

## 2020-01-22 ENCOUNTER — Other Ambulatory Visit: Payer: Self-pay | Admitting: Endocrinology

## 2020-01-22 DIAGNOSIS — Z1231 Encounter for screening mammogram for malignant neoplasm of breast: Secondary | ICD-10-CM

## 2020-02-13 DIAGNOSIS — Z20822 Contact with and (suspected) exposure to covid-19: Secondary | ICD-10-CM | POA: Diagnosis not present

## 2020-02-17 DIAGNOSIS — Z794 Long term (current) use of insulin: Secondary | ICD-10-CM | POA: Diagnosis not present

## 2020-02-17 DIAGNOSIS — E1029 Type 1 diabetes mellitus with other diabetic kidney complication: Secondary | ICD-10-CM | POA: Diagnosis not present

## 2020-02-27 DIAGNOSIS — E1021 Type 1 diabetes mellitus with diabetic nephropathy: Secondary | ICD-10-CM | POA: Diagnosis not present

## 2020-03-10 ENCOUNTER — Ambulatory Visit
Admission: RE | Admit: 2020-03-10 | Discharge: 2020-03-10 | Disposition: A | Discharge status: Home / Self Care | Payer: BC Managed Care – PPO | Source: Ambulatory Visit | Attending: Endocrinology | Admitting: Endocrinology

## 2020-03-10 ENCOUNTER — Other Ambulatory Visit: Payer: Self-pay

## 2020-03-10 DIAGNOSIS — Z1231 Encounter for screening mammogram for malignant neoplasm of breast: Secondary | ICD-10-CM

## 2020-03-12 DIAGNOSIS — G4733 Obstructive sleep apnea (adult) (pediatric): Secondary | ICD-10-CM | POA: Diagnosis not present

## 2020-04-14 DIAGNOSIS — E1021 Type 1 diabetes mellitus with diabetic nephropathy: Secondary | ICD-10-CM | POA: Diagnosis not present

## 2020-05-03 ENCOUNTER — Telehealth (INDEPENDENT_AMBULATORY_CARE_PROVIDER_SITE_OTHER): Payer: Medicare Other | Admitting: Adult Health

## 2020-05-03 ENCOUNTER — Ambulatory Visit: Payer: Self-pay | Admitting: Adult Health

## 2020-05-03 DIAGNOSIS — Z9989 Dependence on other enabling machines and devices: Secondary | ICD-10-CM | POA: Diagnosis not present

## 2020-05-03 DIAGNOSIS — G4733 Obstructive sleep apnea (adult) (pediatric): Secondary | ICD-10-CM | POA: Diagnosis not present

## 2020-05-03 NOTE — Progress Notes (Signed)
PATIENT: Emily Combs DOB: 1955/11/12  REASON FOR VISIT: follow up HISTORY FROM: patient  Virtual Visit via Video Note  I connected with Emily Combs on 05/03/20 at 11:30 AM EST by a video enabled telemedicine application located remotely at [facility] and verified that I am speaking with the correct person using two identifiers who was located at their own home.   I discussed the limitations of evaluation and management by telemedicine and the availability of in person appointments. The patient expressed understanding and agreed to proceed.   PATIENT: Emily Combs [facility] DOB: Aug 20, 1955  REASON FOR VISIT: follow up HISTORY FROM: patient  HISTORY OF PRESENT ILLNESS: Today 05/03/20:  Emily Combs is a 65 year old female with a history of obstructive sleep apnea on CPAP.  Her download indicates that she use her machine nightly for compliance of 100%.  She use her machine greater than 4 hours each night.  On average she uses her machine 7 hours and 6 minutes.  Her residual AHI is 1.9 on 10 cm of water.  Does not have a leak.  Overall she feels that the CPAP is working well for her.  She denies any new issues.  She returns today for an evaluation.   HISTORY Emily Combs is a 65 year old female with a history of obstructive sleep apnea on CPAP.  Her download indicates that she use her machine 30 out of 30 days for compliance of 100%.  She used her machine greater than 4 hours 29 out of 30 days for compliance of 98%.  On average she uses her machine 6 hours and 12 minutes.  Her residual AHI is 1.4 on 10 cm of water with EPR of 1.  Her leak in the 95th percentile is 5.4 L/min.  She returns today for an evaluation.  REVIEW OF SYSTEMS: Out of a complete 14 system review of symptoms, the patient complains only of the following symptoms, and all other reviewed systems are negative.  ALLERGIES: No Known Allergies  HOME MEDICATIONS: Outpatient Medications Prior to Visit   Medication Sig Dispense Refill  . aspirin 81 MG tablet Take 81 mg by mouth daily.     . Calcium Carbonate-Vitamin D (CALCIUM + D PO) Take 500 mg by mouth 2 (two) times daily.     . insulin aspart (FIASP FLEXTOUCH) 100 UNIT/ML FlexPen Inject into the skin 3 (three) times daily with meals. [name] is administered through insulin pump.     Marland Kitchen JARDIANCE 10 MG TABS tablet Take 1 tablet by mouth daily.  4  . Multiple Vitamins-Minerals (MULTIVITAMIN WITH MINERALS) tablet Take 1 tablet by mouth daily.      Marland Kitchen OVER THE COUNTER MEDICATION Biotin two tablets daily.    Marland Kitchen PRISTIQ 50 MG 24 hr tablet Take 1 tablet by mouth Daily.    Marland Kitchen VYTORIN 10-20 MG per tablet Take 1 tablet by mouth Daily.     No facility-administered medications prior to visit.    PAST MEDICAL HISTORY: Past Medical History:  Diagnosis Date  . Adenomatous colon polyp   . Allergy   . Anxiety   . Cataract   . Depression   . Diabetes mellitus    type I  on insulin pump  . GERD (gastroesophageal reflux disease)   . Hyperlipidemia   . Insulin pump in place   . Sleep apnea   . Sleep apnea, obstructive     with AHI of 13.8 , RDI of 17.9 -SPLIT study at [facility]  06-21-12-uses cpap    PAST SURGICAL HISTORY: Past Surgical History:  Procedure Laterality Date  . APPENDECTOMY  2007  . CARPAL TUNNEL RELEASE  2000   bilateral  . CATARACT EXTRACTION  2013   both  . CESAREAN SECTION  1989  . COLONOSCOPY    . 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 EYE SURGERY    . hands     trigger finger release/ multiple  . TRIGGER FINGER RELEASE  11/16/2011   Procedure: RELEASE TRIGGER FINGER/A-1 PULLEY;  Surgeon: Wyn Forster., MD;  Location: [facility];  Service: Orthopedics;  Laterality: Left;  Left hand dupuytrens and A-1 pulley release left thumb,left long and small  fingers.  Marland Kitchen TRIGGER FINGER RELEASE Right 04/30/2014   Procedure: RIGHT LONG TRIGGER RELEASE;  Surgeon: Betha Loa, MD;  Location: [facility];  Service: Orthopedics;  Laterality: Right;  . WRIST SURGERY  01/2011    right 2012      [id number]    FAMILY HISTORY: Family History  Problem Relation Age of Onset  . Colon cancer Paternal Grandmother   . Tuberculosis Maternal Aunt   . Tuberculosis Maternal Grandmother   . Stomach cancer Paternal Aunt   . Esophageal cancer Neg Hx   . Pancreatic cancer Neg Hx   . Rectal cancer Neg Hx     SOCIAL HISTORY: Social History   Socioeconomic History  . Marital status: Divorced    Spouse name: Not on file  . Number of children: 1  . Years of education: BS  . Highest education level: Not on file  Occupational History  . Occupation: Recruitment consultant  Tobacco Use  . Smoking status: Never Smoker  . Smokeless tobacco: Never Used  Substance and Sexual Activity  . Alcohol use: Yes    Alcohol/week: 1.0 standard drink    Types: 1 Glasses of wine per week    Comment: occasionally  . Drug use: No  . Sexual activity: Not on file  Other Topics Concern  . Not on file  Social History Narrative   Patient is divorced and lives at home with her son.   Patient is working full-time.   Patient has a BS degree.   Patient is right-handed.   Patient drinks three cups of coffee and occasionally tea.   Social Determinants of Health   Financial Resource Strain: Not on file  Food Insecurity: Not on file  Transportation Needs: Not on file  Physical Activity: Not on file  Stress: Not on file  Social Connections: Not on file  Intimate Partner Violence: Not on file      PHYSICAL EXAM Generalized: Well developed, in no acute distress   Neurological examination  Mentation: Alert oriented to time, place, history taking. Follows all commands speech and language fluent Cranial nerve II-XII:Extraocular movements were full. Facial symmetry  noted. uvula tongue midline. Head turning and shoulder shrug  were normal and symmetric. Motor: Good strength throughout subjectively per patient Sensory: Sensory testing is intact to soft touch on all 4 extremities subjectively per patient Coordination: Cerebellar testing reveals good finger-nose-finger  Gait and station: Patient is able to stand from a seated position. gait is normal.  Reflexes: UTA  DIAGNOSTIC DATA (LABS, IMAGING, TESTING) - I reviewed patient records, labs, notes, testing and imaging myself where available.  Lab Results  Component Value Date   HGB 13.9 04/30/2014   HCT 41.0 04/30/2014      Component Value Date/Time   NA 140 04/30/2014 1106   K 4.0 04/30/2014 1106   CL 99 04/30/2014 1106   CO2 27 11/14/2011 1453   GLUCOSE 166 (H) 04/30/2014 1106   BUN 17 04/30/2014 1106   CREATININE 0.60 04/30/2014 1106   CALCIUM 9.8 11/14/2011 1453   GFRNONAA >90 11/14/2011 1453   GFRAA >90 11/14/2011 1453      ASSESSMENT AND PLAN 65 y.o. year old female  has a past medical history of Adenomatous colon polyp, Allergy, Anxiety, Cataract, Depression, Diabetes mellitus, GERD (gastroesophageal reflux disease), Hyperlipidemia, Insulin pump in place, Sleep apnea, and Sleep apnea, obstructive. here with:  OSA on CPAP  . CPAP compliance excellent . Residual AHI is good . Encouraged patient to continue using CPAP nightly and > 4 hours each night . F/U in 1 year or sooner if needed  I spent 20 minutes of face-to-face and non-face-to-face time with patient.  This included previsit chart review, lab review, study review, order entry, electronic health record documentation, patient education.  Ward Givens, MSN, NP-C 05/03/2020, 11:29 AM [facility] [address], [place], [place] [address] [phone]

## 2020-06-09 ENCOUNTER — Encounter: Payer: Self-pay | Admitting: Internal Medicine

## 2020-06-16 ENCOUNTER — Telehealth: Payer: Self-pay | Admitting: Adult Health

## 2020-06-16 DIAGNOSIS — G4733 Obstructive sleep apnea (adult) (pediatric): Secondary | ICD-10-CM

## 2020-06-16 NOTE — Telephone Encounter (Signed)
-----   Message from Eitzen sent at 06/16/2020  2:14 PM EST ----- Did you receive a message or letter on this patient? If not can you send an order for cpap supplies to [facility]?

## 2020-06-16 NOTE — Telephone Encounter (Signed)
Order sent for new supplies please send to DME

## 2020-06-16 NOTE — Telephone Encounter (Signed)
CM sent to [facility] for new supplies.

## 2020-06-21 NOTE — Telephone Encounter (Signed)
Brown, Jessica  Mikaiya Tramble S, RN Thank you, will process.   

## 2020-08-10 ENCOUNTER — Telehealth: Payer: Self-pay | Admitting: *Deleted

## 2020-08-10 NOTE — Telephone Encounter (Signed)
Emily Combs,  This pt is scheduled for a colonoscopy with Dr Henrene Pastor 5-13 Friday Her PCP is Dr Forde Dandy- she has an insulin pump-  Will you please get her pump instructions for her colon 3 Thanks , [name] PV

## 2020-08-11 NOTE — Telephone Encounter (Signed)
Faxed insulin pump letter to Dr. Baldwin Crown office.  Awaiting response

## 2020-08-23 NOTE — Telephone Encounter (Signed)
Patient's PV is Friday. FYI... do you have any updates about the insulin pump instructions?

## 2020-08-23 NOTE — Telephone Encounter (Signed)
Left message with Dr. Baldwin Crown assistant.  Will continue to follow up and let you know

## 2020-08-24 NOTE — Telephone Encounter (Signed)
Tisha from Dr. Forde Dandy office returned call.

## 2020-08-24 NOTE — Telephone Encounter (Signed)
refaxed insulin pump letter to all the numbers provided

## 2020-08-24 NOTE — Telephone Encounter (Signed)
Emily Combs with Dr. Forde Dandy office called again, she is asking if you can refax letter. She said that they are having problem with her fax, pls try these fax numbers: [phone], [phone], and [phone], Urgent attn. Emily Combs.

## 2020-08-27 ENCOUNTER — Other Ambulatory Visit: Payer: Self-pay

## 2020-08-27 ENCOUNTER — Ambulatory Visit (AMBULATORY_SURGERY_CENTER): Payer: Medicare Other | Admitting: *Deleted

## 2020-08-27 VITALS — Ht 63.0 in | Wt 160.0 lb

## 2020-08-27 DIAGNOSIS — Z86010 Personal history of colonic polyps: Secondary | ICD-10-CM

## 2020-08-27 MED ORDER — SUPREP BOWEL PREP KIT 17.5-3.13-1.6 GM/177ML PO SOLN: 1.0000 | Freq: Once | ORAL | 0 refills | Status: AC

## 2020-08-27 NOTE — Telephone Encounter (Signed)
Spoke with pt at her PV today.  She states that Dr. Baldwin Crown office nurse has already called her and instructed her regarding her insulin pump

## 2020-08-27 NOTE — Progress Notes (Signed)
Pt's previsit is done over the phone and all paperwork (prep instructions, blank consent form to just read over, pre-procedure acknowledgement form and stamped envelope) sent to patient   No trouble with anesthesia, denies trouble moving neck, or hx/fam hx of malignant hyperthermia per pt  Pt states Dr. Baldwin Crown office nurse called her and instructed her regarding insulin pump   No egg or soy allergy  No home oxygen use   No medications for weight loss taken  Pt denies constipation issues  Pt informed that we do not do prior authorizations for prep

## 2020-09-10 ENCOUNTER — Encounter: Payer: Self-pay | Admitting: Internal Medicine

## 2020-09-10 ENCOUNTER — Ambulatory Visit (AMBULATORY_SURGERY_CENTER): Payer: Medicare Other | Admitting: Internal Medicine

## 2020-09-10 ENCOUNTER — Other Ambulatory Visit: Payer: Self-pay

## 2020-09-10 VITALS — BP 110/55 | HR 83 | Temp 96.2°F | Resp 17 | Ht 63.0 in | Wt 160.0 lb

## 2020-09-10 DIAGNOSIS — Z860100 Personal history of colon polyps, unspecified: Secondary | ICD-10-CM

## 2020-09-10 MED ORDER — SODIUM CHLORIDE 0.9 % IV SOLN: 500.0000 mL | Freq: Once | INTRAVENOUS | Status: DC

## 2020-09-10 NOTE — Progress Notes (Signed)
A and O x3. Report to RN. Tolerated MAC anesthesia well.

## 2020-09-10 NOTE — Patient Instructions (Signed)
RETURN IN 5 YEARS FOR REPEAT COLONOSCOPY  YOU HAD AN ENDOSCOPIC PROCEDURE TODAY AT THE [facility]:   Refer to the procedure report that was given to you for any specific questions about what was found during the examination.  If the procedure report does not answer your questions, please call your gastroenterologist to clarify.  If you requested that your care partner not be given the details of your procedure findings, then the procedure report has been included in a sealed envelope for you to review at your convenience later.  YOU SHOULD EXPECT: Some feelings of bloating in the abdomen. Passage of more gas than usual.  Walking can help get rid of the air that was put into your GI tract during the procedure and reduce the bloating. If you had a lower endoscopy (such as a colonoscopy or flexible sigmoidoscopy) you may notice spotting of blood in your stool or on the toilet paper. If you underwent a bowel prep for your procedure, you may not have a normal bowel movement for a few days.  Please Note:  You might notice some irritation and congestion in your nose or some drainage.  This is from the oxygen used during your procedure.  There is no need for concern and it should clear up in a day or so.  SYMPTOMS TO REPORT IMMEDIATELY:   Following lower endoscopy (colonoscopy or flexible sigmoidoscopy):  Excessive amounts of blood in the stool  Significant tenderness or worsening of abdominal pains  Swelling of the abdomen that is new, acute  Fever of 100F or higher  For urgent or emergent issues, a gastroenterologist can be reached at any hour by calling [phone]. Do not use MyChart messaging for urgent concerns.    DIET:  We do recommend a small meal at first, but then you may proceed to your regular diet.  Drink plenty of fluids but you should avoid alcoholic beverages for 24 hours.  ACTIVITY:  You should plan to take it easy for the rest of today and you should NOT DRIVE or  use heavy machinery until tomorrow (because of the sedation medicines used during the test).    FOLLOW UP: Our staff will call the number listed on your records 48-72 hours following your procedure to check on you and address any questions or concerns that you may have regarding the information given to you following your procedure. If we do not reach you, we will leave a message.  We will attempt to reach you two times.  During this call, we will ask if you have developed any symptoms of COVID 19. If you develop any symptoms (ie: fever, flu-like symptoms, shortness of breath, cough etc.) before then, please call [phone].  If you test positive for Covid 19 in the 2 weeks post procedure, please call and report this information to [place].    If any biopsies were taken you will be contacted by phone or by letter within the next 1-3 weeks.  Please call us at [phone] if you have not heard about the biopsies in 3 weeks.    SIGNATURES/CONFIDENTIALITY: You and/or your care partner have signed paperwork which will be entered into your electronic medical record.  These signatures attest to the fact that that the information above on your After Visit Summary has been reviewed and is understood.  Full responsibility of the confidentiality of this discharge information lies with you and/or your care-partner.

## 2020-09-10 NOTE — Progress Notes (Signed)
Vitals by NS. 

## 2020-09-10 NOTE — Op Note (Signed)
[place] Patient Name: Emily Combs Procedure Date: 09/10/2020 9:18 AM MRN: [id number] Endoscopist: Docia Chuck. Henrene Pastor , MD Age: 65 Referring MD:  Date of Birth: November 02, 1955 Gender: Female Account #: [id number] Procedure:                Colonoscopy Indications:              High risk colon cancer surveillance: Personal                            history of adenoma (10 mm or greater in size), High                            risk colon cancer surveillance: Personal history of                            adenoma with villous component, High risk colon                            cancer surveillance: Personal history of multiple                            (3 or more) adenomas. Previous examinations 2009,                            2013, 2018 Medicines:                Monitored Anesthesia Care Procedure:                Pre-Anesthesia Assessment:                           - Prior to the procedure, a History and Physical                            was performed, and patient medications and                            allergies were reviewed. The patient's tolerance of                            previous anesthesia was also reviewed. The risks                            and benefits of the procedure and the sedation                            options and risks were discussed with the patient.                            All questions were answered, and informed consent                            was obtained. Prior Anticoagulants: The patient has  taken no previous anticoagulant or antiplatelet                            agents. ASA Grade Assessment: II - A patient with                            mild systemic disease. After reviewing the risks                            and benefits, the patient was deemed in                            satisfactory condition to undergo the procedure.                           After obtaining informed consent, the colonoscope                             was passed under direct vision. Throughout the                            procedure, the patient's blood pressure, pulse, and                            oxygen saturations were monitored continuously. The                            Olympus CF-HQ190 [id number]) Colonoscope was                            introduced through the anus and advanced to the the                            cecum, identified by appendiceal orifice and                            ileocecal valve. The ileocecal valve, appendiceal                            orifice, and rectum were photographed. The quality                            of the bowel preparation was excellent. The                            colonoscopy was performed without difficulty. The                            patient tolerated the procedure well. The bowel                            preparation used was SUPREP via split dose  instruction. Scope In: 9:34:43 AM Scope Out: 9:45:33 AM Scope Withdrawal Time: 0 hours 7 minutes 34 seconds  Total Procedure Duration: 0 hours 10 minutes 50 seconds  Findings:                 The entire examined colon appeared normal on direct                            and retroflexion views. Complications:            No immediate complications. Estimated blood loss:                            None. Estimated Blood Loss:     Estimated blood loss: none. Impression:               - The entire examined colon is normal on direct and                            retroflexion views.                           - No specimens collected. Recommendation:           - Repeat colonoscopy in 5 years for surveillance                            (personal history).                           - Patient has a contact number available for                            emergencies. The signs and symptoms of potential                            delayed complications were discussed with the                             patient. Return to normal activities tomorrow.                            Written discharge instructions were provided to the                            patient.                           - Resume previous diet.                           - Continue present medications. Docia Chuck. Henrene Pastor, MD 09/10/2020 9:49:35 AM This report has been signed electronically.

## 2020-09-14 ENCOUNTER — Telehealth: Payer: Self-pay | Admitting: *Deleted

## 2020-09-14 NOTE — Telephone Encounter (Signed)
First attempt, left VM.  

## 2020-09-14 NOTE — Telephone Encounter (Signed)
  Follow up Call-  Call back number 09/10/2020  Post procedure Call Back phone  # [phone]  Permission to leave phone message Yes  Some recent data might be hidden     Patient questions:  Do you have a fever, pain , or abdominal swelling? No. Pain Score  0 *  Have you tolerated food without any problems? Yes.    Have you been able to return to your normal activities? Yes.    Do you have any questions about your discharge instructions: Diet   No. Medications  No. Follow up visit  No.  Do you have questions or concerns about your Care? No.  Actions: * If pain score is 4 or above: 1. No action needed, pain <4.Have you developed a fever since your procedure? no  2.   Have you had an respiratory symptoms (SOB or cough) since your procedure? no  3.   Have you tested positive for COVID 19 since your procedure no  4.   Have you had any family members/close contacts diagnosed with the COVID 19 since your procedure?  no   If yes to any of these questions please route to Joylene John, RN and Joella Prince, RN

## 2020-10-21 ENCOUNTER — Encounter (INDEPENDENT_AMBULATORY_CARE_PROVIDER_SITE_OTHER): Payer: Medicare Other | Admitting: Ophthalmology

## 2020-12-16 ENCOUNTER — Encounter (INDEPENDENT_AMBULATORY_CARE_PROVIDER_SITE_OTHER): Payer: Self-pay | Admitting: Ophthalmology

## 2020-12-16 ENCOUNTER — Other Ambulatory Visit: Payer: Self-pay

## 2020-12-16 ENCOUNTER — Ambulatory Visit (INDEPENDENT_AMBULATORY_CARE_PROVIDER_SITE_OTHER): Payer: Medicare Other | Admitting: Ophthalmology

## 2020-12-16 DIAGNOSIS — E103293 Type 1 diabetes mellitus with mild nonproliferative diabetic retinopathy without macular edema, bilateral: Secondary | ICD-10-CM | POA: Diagnosis not present

## 2020-12-16 DIAGNOSIS — E109 Type 1 diabetes mellitus without complications: Secondary | ICD-10-CM | POA: Diagnosis not present

## 2020-12-16 DIAGNOSIS — Z9641 Presence of insulin pump (external) (internal): Secondary | ICD-10-CM

## 2020-12-16 NOTE — Assessment & Plan Note (Signed)
52 years of known type 1 diabetes mellitus and only mild nonproliferative diabetic retinopathy present in either eye.  This is fantastic

## 2020-12-16 NOTE — Progress Notes (Signed)
12/16/2020     CHIEF COMPLAINT Patient presents for Retina Follow Up   HISTORY OF PRESENT ILLNESS: Emily Combs is a 65 y.o. female who presents to the clinic today for:   HPI     Retina Follow Up           Diagnosis: Diabetic Retinopathy   Laterality: both eyes   Onset: 1 year ago   Duration: 1 year   Course: stable         Comments   1 yr fu ou oct fp Patient states vision is stable and unchanged since last visit. Denies any new floaters or FOL. A1C: 6.6 ~2 mos ago BS: 123 this morning      Last edited by Laurin Coder, COA on 12/16/2020  3:52 PM.      Referring physician: Reynold Bowen, MD [place],  [place] [id number]  HISTORICAL INFORMATION:   Selected notes from the [facility]: No current outpatient medications on file. (Ophthalmic Drugs)   No current facility-administered medications for this visit. (Ophthalmic Drugs)   Current Outpatient Medications (Other)  Medication Sig   Calcium Carbonate-Vitamin D (CALCIUM + D PO) Take 500 mg by mouth 2 (two) times daily.   Insulin Aspart, w/Niacinamide, (FIASP) 100 UNIT/ML SOLN Inject into the skin. Via insulin pump   JARDIANCE 10 MG TABS tablet Take 1 tablet by mouth daily.   Multiple Vitamins-Minerals (MULTIVITAMIN WITH MINERALS) tablet Take 1 tablet by mouth daily.   PRISTIQ 50 MG 24 hr tablet Take 1 tablet by mouth Daily.   VYTORIN 10-20 MG per tablet Take 1 tablet by mouth Daily.   No current facility-administered medications for this visit. (Other)      REVIEW OF SYSTEMS:    ALLERGIES No Known Allergies  PAST MEDICAL HISTORY Past Medical History:  Diagnosis Date   Adenomatous colon polyp    Allergy    Anemia    hx of   Anxiety    Cataract    Depression    Diabetes mellitus    type I  on insulin pump   GERD (gastroesophageal reflux disease)    Hyperlipidemia    Insulin pump in place    Sleep apnea    wears CPAP   Sleep  apnea, obstructive     with AHI of 13.8 , RDI of 17.9 -SPLIT study at [facility]  06-21-12-uses cpap   Past Surgical History:  Procedure Laterality Date   APPENDECTOMY  2007   CARPAL TUNNEL RELEASE  2000   bilateral   CATARACT EXTRACTION  2013   both   [facility]   COLONOSCOPY     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   TRIGGER FINGER RELEASE Right 04/30/2014   Procedure: RIGHT LONG TRIGGER RELEASE;  Surgeon: Leanora Cover, MD;  Location: [facility];  Service: Orthopedics;  Laterality: Right;   WRIST SURGERY  01/2011  right 2012      left1987    FAMILY HISTORY Family History  Problem Relation Age of Onset   Colon cancer Paternal Grandmother    Tuberculosis Maternal Aunt    Stomach cancer Maternal Aunt    Tuberculosis Maternal Grandmother    Esophageal cancer Neg Hx    Pancreatic cancer Neg Hx    Rectal cancer Neg Hx     SOCIAL HISTORY Social History   Tobacco Use   Smoking status: Never   Smokeless tobacco: Never  Vaping Use   Vaping Use: Never used  Substance Use Topics   Alcohol use: Yes    Alcohol/week: 1.0 standard drink    Types: 1 Glasses of wine per week    Comment: occasionally   Drug use: No         OPHTHALMIC EXAM:  Base Eye Exam     Visual Acuity (ETDRS)       Right Left   Dist [place] 20/20 20/20         Tonometry (Tonopen, 3:57 PM)       Right Left   Pressure 14 13         Pupils       Pupils Dark Light Shape React  APD   Right PERRL 4 3 Round Brisk None   Left PERRL 4 3 Round Brisk None         Visual Fields (Counting fingers)       Left Right    Full Full         Extraocular Movement       Right Left    Full Full         Neuro/Psych     Oriented x3: Yes   Mood/Affect: Normal         Dilation     Both eyes: 1.0% Mydriacyl, 2.5% Phenylephrine @ 3:57 PM           Slit Lamp and Fundus Exam     External Exam       Right Left   External Normal Normal         Slit Lamp Exam       Right Left   Lids/Lashes Normal Normal   Conjunctiva/Sclera White and quiet White and quiet   Cornea Clear Clear   Anterior Chamber Deep and quiet Deep and quiet   Iris Round and reactive Round and reactive   Lens Posterior chamber intraocular lens Posterior chamber intraocular lens   Anterior Vitreous Normal Normal         Fundus Exam       Right Left   Posterior Vitreous Normal Normal   Disc Normal Normal   C/D Ratio 0.45 0.45   Macula Microaneurysms, no clinically significant macular edema Microaneurysms, no clinically significant macular edema   Vessels NPDR- Mild NPDR- Mild   Periphery Normal Normal            IMAGING AND PROCEDURES  Imaging and Procedures for 12/16/20  Color Fundus Photography Optos - OU - Both Eyes       Right Eye Progression has been stable. Disc findings include normal observations. Macula : microaneurysms. Vessels : normal observations (Mild NPDR, type 1 diabetes mellitus). Periphery : normal observations.   Left Eye Progression has been stable. Disc findings include normal observations. Macula : microaneurysms. Vessels : normal observations. Periphery : normal observations (Mild NPDR, type 1 diabetes mellitus).   Notes No active disease     OCT, Retina -  OU - Both Eyes       Right Eye Quality was good. Scan locations included subfoveal. Central Foveal Thickness: 250. Progression has been stable. Findings include normal foveal  contour.   Left Eye Quality was good. Scan locations included subfoveal. Central Foveal Thickness: 249. Progression has been stable. Findings include normal foveal contour.   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  This is fantastic     ICD-10-CM   1. Mild nonproliferative diabetic retinopathy of both eyes without macular edema associated with type 1 diabetes mellitus (HCC)  [id number] Color Fundus Photography Optos - OU - Both Eyes    OCT, Retina - OU - Both Eyes    2. Diabetes mellitus type 1, uncomplicated, on long term insulin pump (HCC)  E10.9    Z96.41       1.  Only mild NPDR.  He 5 years duration of type 1 diabetes this is amazing  2.  I explained to the patient that we will continue to look in the far temporal watershed areas of the peripheral retina for retinopathy and should it develop that local PRP at an anterior to the equator can can halt progression and vegF release from an active nonperfusion.  Neither eye has any appearance of this at all currently  3.  Ophthalmic Meds Ordered this visit:  No orders of the defined types were placed in this encounter.      Return in about 1 year (around 12/16/2021) for COLOR FP, OCT.  There are no Patient Instructions on file for this visit.   Explained the diagnoses, plan, and follow up with the patient and they expressed understanding.  Patient expressed understanding of the importance of proper follow up care.   Clent Demark Dorinne Graeff M.D. Diseases & Surgery of the Retina and Vitreous [facility]ina [facility] Diabetic [place] 12/16/20     Abbreviations: M myopia (nearsighted); A astigmatism; H hyperopia (farsighted); P presbyopia; Mrx spectacle prescription;  CTL contact  lenses; OD right eye; OS left eye; OU both eyes  XT exotropia; ET esotropia; PEK punctate epithelial keratitis; PEE punctate epithelial erosions; DES dry eye syndrome; MGD meibomian gland dysfunction; ATs artificial tears; PFAT's preservative free artificial tears; [place] nuclear sclerotic cataract; PSC posterior subcapsular cataract; ERM epi-retinal membrane; PVD posterior vitreous detachment; RD retinal detachment; DM diabetes mellitus; DR diabetic retinopathy; NPDR non-proliferative diabetic retinopathy; PDR proliferative diabetic retinopathy; CSME clinically significant macular edema; DME diabetic macular edema; dbh dot blot hemorrhages; CWS cotton wool spot; POAG primary open angle glaucoma; C/D cup-to-disc ratio; HVF humphrey visual field; GVF goldmann visual field; OCT optical coherence tomography; IOP intraocular pressure; BRVO Branch retinal vein occlusion; CRVO central retinal vein occlusion; CRAO central retinal artery occlusion; BRAO branch retinal artery occlusion; RT retinal tear; SB scleral buckle; PPV pars plana vitrectomy; VH Vitreous hemorrhage; PRP panretinal laser photocoagulation; IVK intravitreal kenalog; VMT vitreomacular traction; MH Macular hole;  NVD neovascularization of the disc; NVE neovascularization elsewhere; AREDS age related eye disease study; ARMD age related macular degeneration; POAG primary open angle glaucoma; EBMD epithelial/anterior basement membrane dystrophy; ACIOL anterior chamber intraocular lens; IOL intraocular lens; PCIOL posterior chamber intraocular lens; Phaco/IOL phacoemulsification with intraocular lens placement; [place] photorefractive keratectomy; LASIK laser assisted in situ keratomileusis;  HTN hypertension; DM diabetes mellitus; COPD chronic obstructive pulmonary disease

## 2021-01-25 ENCOUNTER — Other Ambulatory Visit: Payer: Self-pay | Admitting: Endocrinology

## 2021-01-25 DIAGNOSIS — Z1231 Encounter for screening mammogram for malignant neoplasm of breast: Secondary | ICD-10-CM

## 2021-03-18 ENCOUNTER — Ambulatory Visit
Admission: RE | Admit: 2021-03-18 | Discharge: 2021-03-18 | Disposition: A | Discharge status: Home / Self Care | Payer: Medicare Other | Source: Ambulatory Visit | Attending: Endocrinology | Admitting: Endocrinology

## 2021-03-18 DIAGNOSIS — Z1231 Encounter for screening mammogram for malignant neoplasm of breast: Secondary | ICD-10-CM

## 2021-05-09 ENCOUNTER — Telehealth: Payer: BLUE CROSS/BLUE SHIELD | Admitting: Adult Health

## 2021-05-10 ENCOUNTER — Telehealth: Payer: Self-pay | Admitting: Adult Health

## 2021-05-10 NOTE — Telephone Encounter (Signed)
Pt was a no show on  05/09/21 at 1:00pm because she did not know that she was suppose to get on her MyChart to for the visit.  Rescheduled pt for 05/17/21 at 2:45pm

## 2021-05-17 ENCOUNTER — Telehealth: Payer: Medicare Other | Admitting: Adult Health

## 2021-12-22 ENCOUNTER — Encounter (INDEPENDENT_AMBULATORY_CARE_PROVIDER_SITE_OTHER): Payer: Medicare Other | Admitting: Ophthalmology

## 2021-12-29 ENCOUNTER — Encounter (INDEPENDENT_AMBULATORY_CARE_PROVIDER_SITE_OTHER): Payer: Self-pay | Admitting: Ophthalmology

## 2021-12-29 ENCOUNTER — Ambulatory Visit (INDEPENDENT_AMBULATORY_CARE_PROVIDER_SITE_OTHER): Payer: Medicare Other | Admitting: Ophthalmology

## 2021-12-29 DIAGNOSIS — E103293 Type 1 diabetes mellitus with mild nonproliferative diabetic retinopathy without macular edema, bilateral: Secondary | ICD-10-CM

## 2021-12-29 DIAGNOSIS — Z961 Presence of intraocular lens: Secondary | ICD-10-CM | POA: Diagnosis not present

## 2021-12-29 NOTE — Assessment & Plan Note (Signed)
The nature of mild nonproliferative diabetic retinopathy was discussed with the patient. Emphasis was placed on tight glucose, blood pressure, and serum lipid control. Avoidance of smoking was emphasized. Maintenance of normal body weight was emphasized. Appropriate follow up dilated exam is 1 year. 

## 2021-12-29 NOTE — Progress Notes (Signed)
12/29/2021     CHIEF COMPLAINT Patient presents for  Chief Complaint  Patient presents with   Diabetic Retinopathy without Macular Edema      HISTORY OF PRESENT ILLNESS: Emily Combs is a 66 y.o. female who presents to the clinic today for:   HPI   Type 1 diabetes mellitus, 56 years duration, patient of mine for some decades with no significant diabetic retinopathy only mild NPDR demonstrable in the past.  Here for follow-up at a 1 year interval.  1 year fu ou oct fp Pt states her vision has been stable Pt denies any new floaters or FOL  Last edited by Hurman Horn, MD on 12/29/2021  4:54 PM.      Referring physician: Reynold Bowen, MD [place],  [place] [id number]  HISTORICAL INFORMATION:   Selected notes from the [facility]: No current outpatient medications on file. (Ophthalmic Drugs)   No current facility-administered medications for this visit. (Ophthalmic Drugs)   Current Outpatient Medications (Other)  Medication Sig   Calcium Carbonate-Vitamin D (CALCIUM + D PO) Take 500 mg by mouth 2 (two) times daily.   Insulin Aspart, w/Niacinamide, (FIASP) 100 UNIT/ML SOLN Inject into the skin. Via insulin pump   JARDIANCE 10 MG TABS tablet Take 1 tablet by mouth daily.   Multiple Vitamins-Minerals (MULTIVITAMIN WITH MINERALS) tablet Take 1 tablet by mouth daily.   PRISTIQ 50 MG 24 hr tablet Take 1 tablet by mouth Daily.   VYTORIN 10-20 MG per tablet Take 1 tablet by mouth Daily.   No current facility-administered medications for this visit. (Other)      REVIEW OF SYSTEMS: ROS   Negative for: Constitutional, Gastrointestinal, Neurological, Skin, Genitourinary, Musculoskeletal, HENT, Endocrine, Cardiovascular, Eyes, Respiratory, Psychiatric, Allergic/Imm, Heme/Lymph Last edited by Morene Rankins, CMA on 12/29/2021  4:20 PM.       ALLERGIES No Known Allergies  PAST MEDICAL HISTORY Past Medical History:   Diagnosis Date   Adenomatous colon polyp    Allergy    Anemia    hx of   Anxiety    Cataract    Depression    Diabetes mellitus    type I  on insulin pump   GERD (gastroesophageal reflux disease)    Hyperlipidemia    Insulin pump in place    Sleep apnea    wears CPAP   Sleep apnea, obstructive     with AHI of 13.8 , RDI of 17.9 -SPLIT study at [facility]  06-21-12-uses cpap   Past Surgical History:  Procedure Laterality Date   APPENDECTOMY  2007   CARPAL TUNNEL RELEASE  2000   bilateral   CATARACT EXTRACTION  2013   both   [place]   COLONOSCOPY     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   TRIGGER FINGER RELEASE Right 04/30/2014   Procedure: RIGHT LONG TRIGGER RELEASE;  Surgeon: Leanora Cover, MD;  Location: [facility];  Service: Orthopedics;  Laterality: Right;   WRIST SURGERY  01/2011    right 2012      [id number]    FAMILY HISTORY Family History  Problem Relation Age of Onset   Colon cancer Paternal Grandmother    Tuberculosis Maternal Aunt    Stomach cancer Maternal Aunt    Tuberculosis Maternal Grandmother    Esophageal cancer Neg Hx    Pancreatic cancer Neg Hx    Rectal cancer Neg Hx     SOCIAL HISTORY Social History   Tobacco Use   Smoking status: Never   Smokeless tobacco: Never  Vaping Use   Vaping Use: Never used  Substance Use Topics   Alcohol use: Yes    Alcohol/week: 1.0 standard drink of alcohol    Types: 1 Glasses of wine  per week    Comment: occasionally   Drug use: No         OPHTHALMIC EXAM:  Base Eye Exam     Visual Acuity (ETDRS)       Right Left   Dist cc 20/20 20/20    Correction: Glasses         Tonometry (Tonopen, 4:24 PM)       Right Left   Pressure 9 11         Pupils       Pupils   Right PERRL   Left PERRL         Visual Fields       Left Right    Full Full         Extraocular Movement       Right Left    Ortho Ortho    -- -- --  --  --  -- -- --   -- -- --  --  --  -- -- --           Neuro/Psych     Oriented x3: Yes   Mood/Affect: Normal         Dilation     Both eyes: 1.0% Mydriacyl, 2.5% Phenylephrine @ 4:21 PM           Slit Lamp and Fundus Exam     External Exam       Right Left   External Normal Normal         Slit Lamp Exam       Right Left   Lids/Lashes Normal Normal   Conjunctiva/Sclera White and quiet White and quiet   Cornea Clear Clear   Anterior Chamber Deep and quiet Deep and quiet   Iris Round and reactive Round and reactive   Lens Posterior chamber intraocular lens Posterior chamber intraocular lens   Anterior Vitreous Normal Normal         Fundus Exam       Right Left   Posterior Vitreous Normal Normal   Disc Normal Normal   C/D Ratio 0.45 0.45   Macula Microaneurysms, no clinically significant macular edema Microaneurysms, no clinically significant macular edema   Vessels NPDR- Mild NPDR- Mild   Periphery Normal Normal            IMAGING AND PROCEDURES  Imaging and Procedures for 12/29/21  OCT, Retina - OU - Both Eyes       Right Eye Quality was good. Scan locations included subfoveal. Central Foveal Thickness: 247. Progression has been stable. Findings include normal foveal contour.   Left Eye Quality was good. Scan locations included subfoveal.  Central Foveal Thickness: 244. Progression has been stable. Findings include normal foveal contour.   Notes Mild npdr, no csme      Color Fundus Photography Optos - OU - Both Eyes       Right Eye Progression has been stable. Disc findings include normal observations. Macula : microaneurysms. Vessels : normal observations (Mild NPDR, type 1 diabetes mellitus). Periphery : normal observations.   Left Eye Progression has been stable. Disc findings include normal observations. Macula : microaneurysms. Vessels : normal observations. Periphery : normal observations (Mild NPDR, type 1 diabetes mellitus).   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) The nature of mild nonproliferative diabetic retinopathy was discussed with the patient. Emphasis was placed on tight glucose, blood pressure, and serum lipid control. Avoidance of smoking was emphasized. Maintenance of normal body weight was emphasized. Appropriate follow up dilated exam is 1 year.    Pseudophakia of both eyes OU look great      ICD-10-CM   1. 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 Pseudophakia of both eyes  Z96.1       1.  Patient continues to do excellently well with blood sugar control.  Astoundingly, still only with minimal to mild NPDR in each eye.  Careful examination peripherally there is no acute signs of retinal nonperfusion.  2.  3.  Ophthalmic Meds Ordered this visit:  No orders of the defined types were placed in this encounter.      Return in about 1 year (around 12/30/2022) for DILATE OU, COLOR FP, OCT.  There are no Patient Instructions on file for this visit.   Explained the diagnoses, plan, and follow up with the patient and they expressed understanding.  Patient expressed understanding of the importance  of proper follow up care.   Clent Demark Darran Gabay M.D. Diseases & Surgery of the Retina and Vitreous [facility]ina [facility] Diabetic [place] 12/29/21     Abbreviations: M myopia (nearsighted); A astigmatism; H hyperopia (farsighted); P presbyopia; Mrx spectacle prescription;  CTL contact lenses; OD right eye; OS left eye; OU both eyes  XT exotropia; ET esotropia; PEK punctate epithelial keratitis; PEE punctate epithelial erosions; DES dry eye syndrome; MGD meibomian gland dysfunction; ATs artificial tears; PFAT's preservative free artificial tears; [place] nuclear sclerotic cataract; PSC posterior subcapsular cataract; ERM epi-retinal membrane; PVD posterior vitreous detachment; RD retinal detachment; DM diabetes mellitus; DR diabetic retinopathy; NPDR non-proliferative diabetic retinopathy; PDR proliferative diabetic retinopathy; CSME clinically significant macular edema; DME diabetic macular edema; dbh dot blot hemorrhages; CWS cotton wool spot; POAG primary open angle glaucoma; C/D cup-to-disc ratio; HVF humphrey visual field; GVF goldmann visual field; OCT optical coherence tomography; IOP intraocular pressure; BRVO Branch retinal vein occlusion; CRVO central retinal vein occlusion; CRAO central retinal artery occlusion; BRAO branch retinal artery occlusion; RT retinal tear; SB scleral buckle; PPV pars plana vitrectomy; VH Vitreous hemorrhage; PRP panretinal laser photocoagulation; IVK intravitreal kenalog; VMT vitreomacular traction; MH Macular hole;  NVD neovascularization of the disc; NVE neovascularization elsewhere; AREDS age related eye disease study; ARMD age related macular degeneration; POAG primary open angle glaucoma; EBMD epithelial/anterior basement membrane dystrophy; ACIOL anterior chamber intraocular  lens; IOL intraocular lens; PCIOL posterior chamber intraocular lens; Phaco/IOL phacoemulsification with intraocular lens placement; [place] photorefractive keratectomy; LASIK laser assisted in situ keratomileusis;  HTN hypertension; DM diabetes mellitus; COPD chronic obstructive pulmonary disease

## 2021-12-29 NOTE — Assessment & Plan Note (Signed)
OU look great

## 2022-04-04 ENCOUNTER — Other Ambulatory Visit: Payer: Self-pay | Admitting: Endocrinology

## 2022-04-04 DIAGNOSIS — Z1231 Encounter for screening mammogram for malignant neoplasm of breast: Secondary | ICD-10-CM

## 2022-05-31 ENCOUNTER — Ambulatory Visit
Admission: RE | Admit: 2022-05-31 | Discharge: 2022-05-31 | Disposition: A | Discharge status: Home / Self Care | Payer: Medicare Other | Source: Ambulatory Visit | Attending: Endocrinology | Admitting: Endocrinology

## 2022-05-31 DIAGNOSIS — Z1231 Encounter for screening mammogram for malignant neoplasm of breast: Secondary | ICD-10-CM

## 2022-06-05 ENCOUNTER — Other Ambulatory Visit: Payer: Self-pay | Admitting: Endocrinology

## 2022-06-05 DIAGNOSIS — R928 Other abnormal and inconclusive findings on diagnostic imaging of breast: Secondary | ICD-10-CM

## 2022-06-07 IMAGING — MG MM DIGITAL SCREENING BILAT W/ TOMO AND CAD
8 series · 8 of 24 positions shown · non-contrast
Comparison: Previous exam(s).

CLINICAL DATA: Screening.

EXAM:
DIGITAL SCREENING BILATERAL MAMMOGRAM WITH TOMOSYNTHESIS AND CAD
TECHNIQUE: Bilateral screening digital craniocaudal and mediolateral oblique
mammograms were obtained. Bilateral screening digital breast
tomosynthesis was performed. The images were evaluated with
computer-aided detection.

[L CC synth-2D]
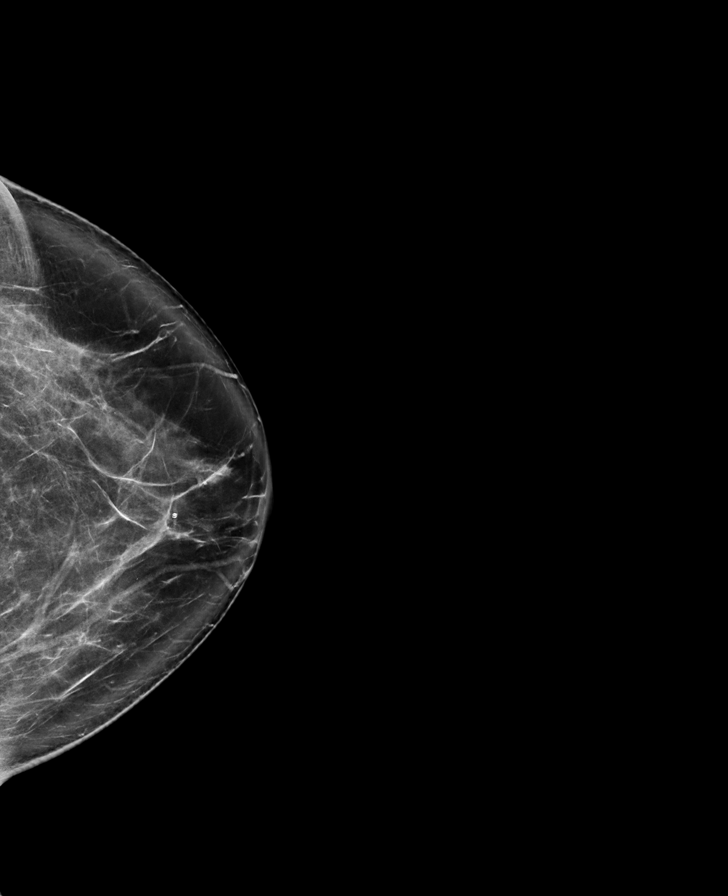

[R CC synth-2D]
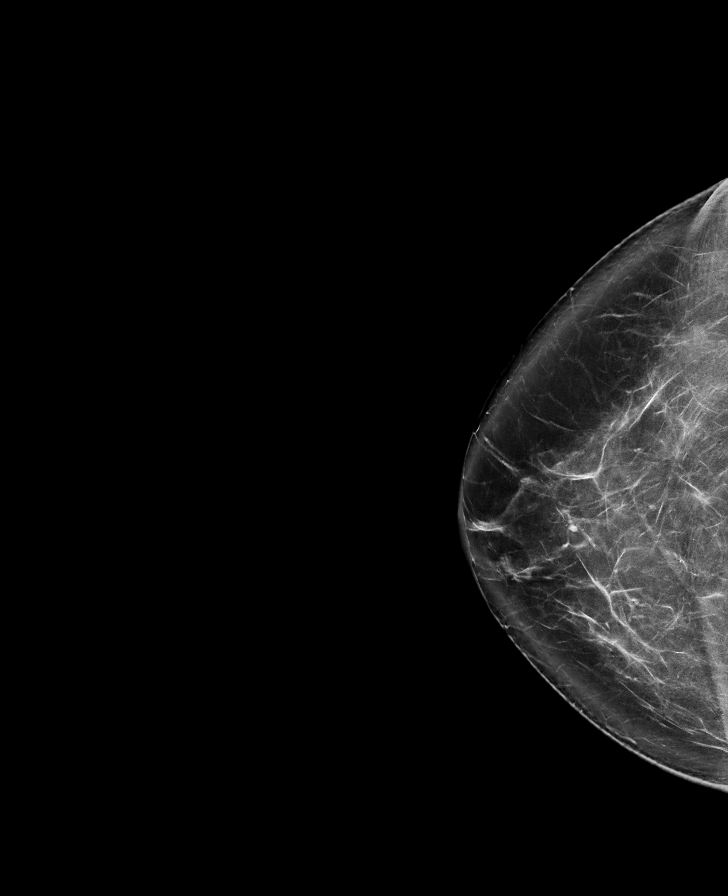

[L MLO synth-2D]
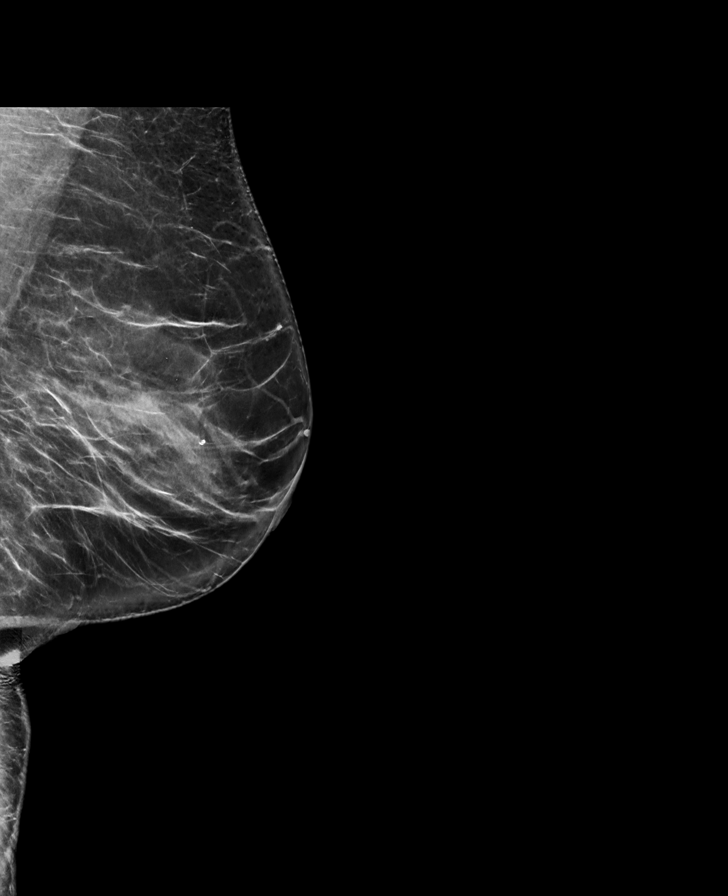

[R MLO synth-2D]
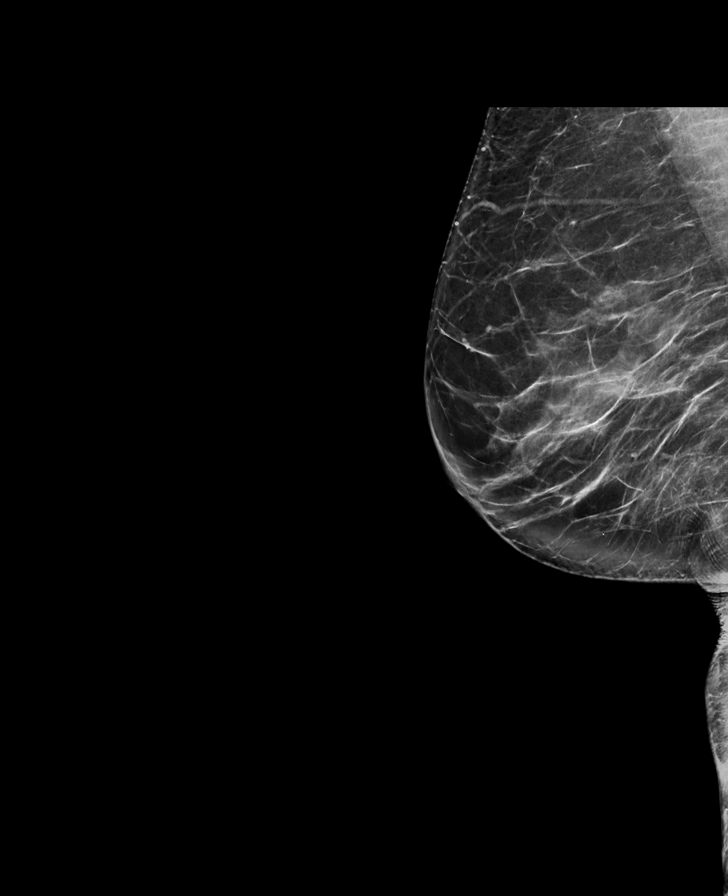

[R MLO tomo · tomo slice 43/86.0]
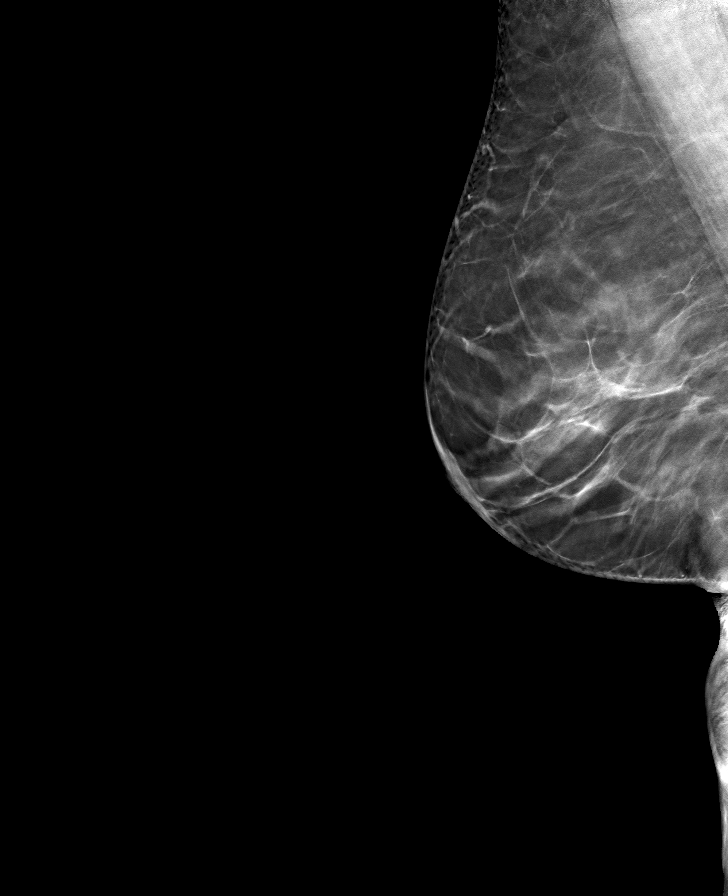

[L CC tomo · tomo slice 45/89.0]
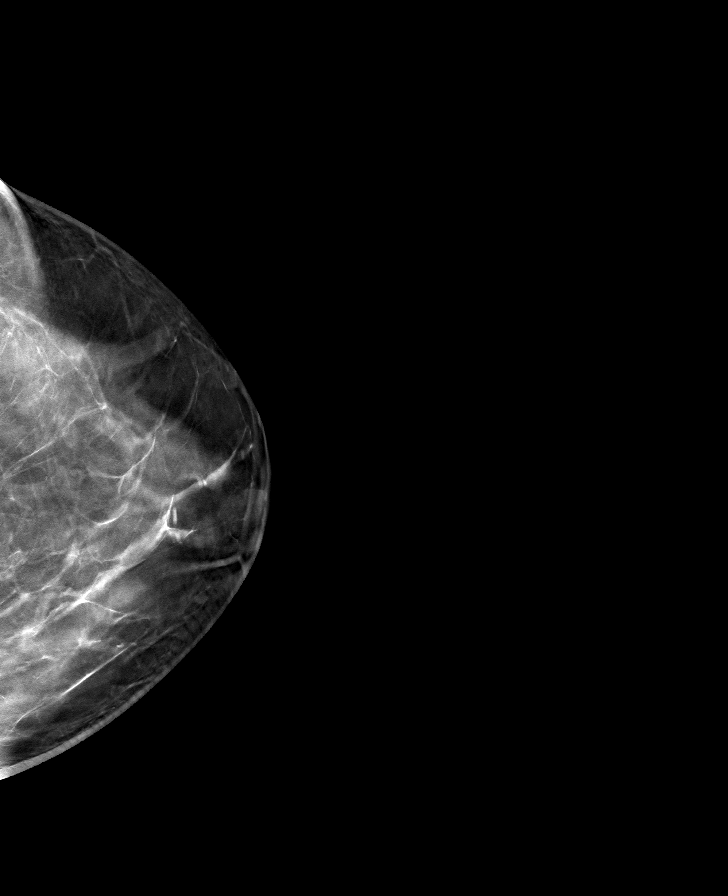

[L MLO tomo · tomo slice 42/83.0]
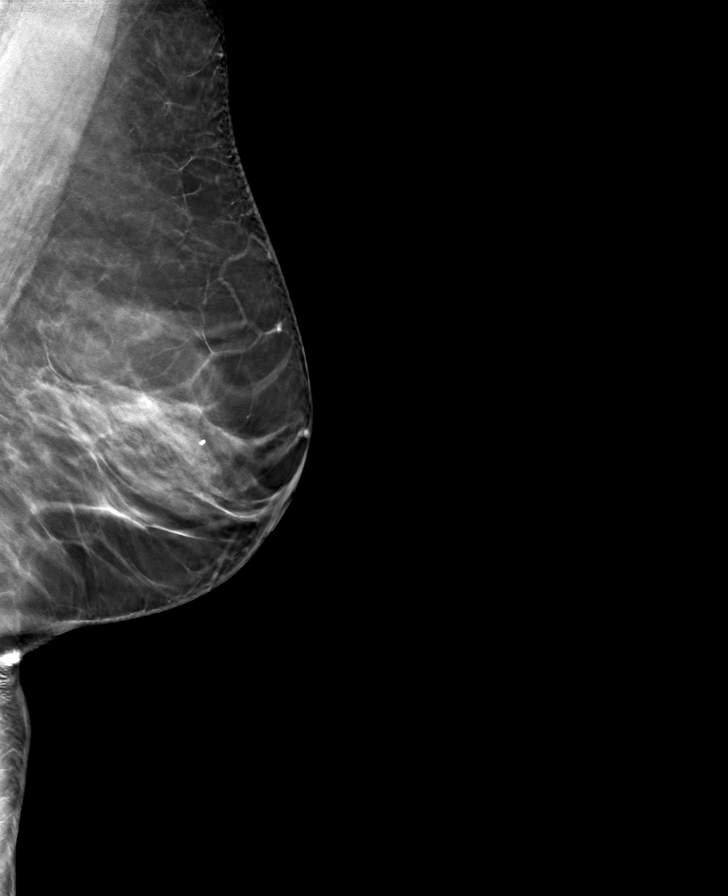

[R CC tomo · tomo slice 45/90.0]
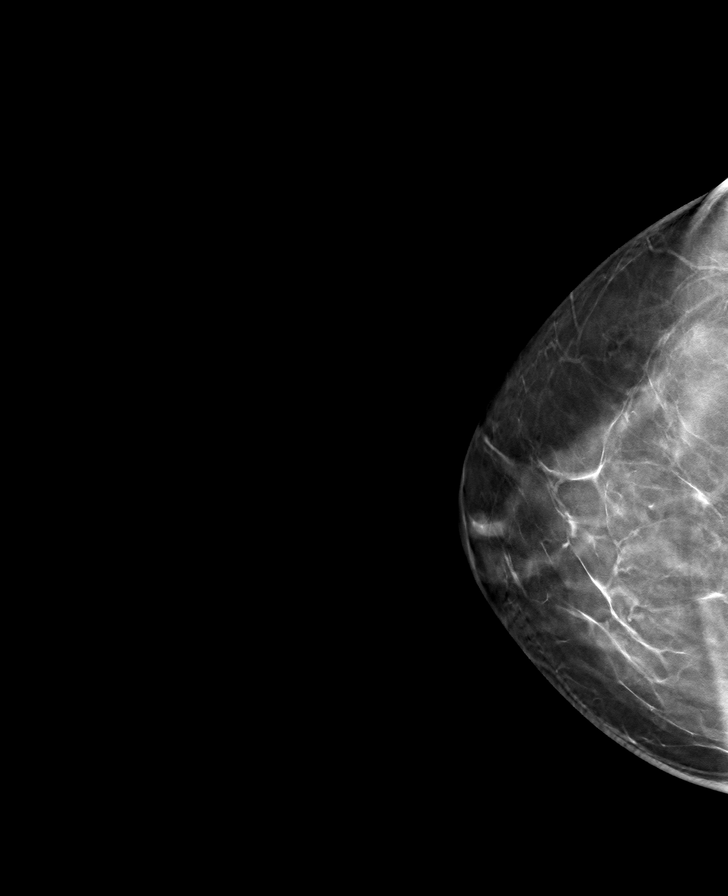

[8 of 24 positions shown; findings below may reference images not displayed]

ACR Breast Density Category c: The breast tissue is heterogeneously
dense, which may obscure small masses.
FINDINGS: There are no findings suspicious for malignancy.
IMPRESSION: No mammographic evidence of malignancy. A result letter of this
screening mammogram will be mailed directly to the patient.

RECOMMENDATION:
Screening mammogram in one year. (Code:[id number])

BI-RADS CATEGORY  1: Negative.

## 2022-06-12 ENCOUNTER — Ambulatory Visit
Admission: RE | Admit: 2022-06-12 | Discharge: 2022-06-12 | Disposition: A | Discharge status: Home / Self Care | Payer: Medicare Other | Source: Ambulatory Visit | Attending: Endocrinology | Admitting: Endocrinology

## 2022-06-12 ENCOUNTER — Ambulatory Visit: Payer: Medicare Other

## 2022-06-12 DIAGNOSIS — R928 Other abnormal and inconclusive findings on diagnostic imaging of breast: Secondary | ICD-10-CM

## 2023-01-02 ENCOUNTER — Encounter (INDEPENDENT_AMBULATORY_CARE_PROVIDER_SITE_OTHER): Payer: Medicare Other | Admitting: Ophthalmology

## 2023-01-04 ENCOUNTER — Encounter (INDEPENDENT_AMBULATORY_CARE_PROVIDER_SITE_OTHER): Payer: Medicare Other | Admitting: Ophthalmology

## 2023-06-08 ENCOUNTER — Other Ambulatory Visit: Payer: Self-pay | Admitting: Endocrinology

## 2023-06-08 DIAGNOSIS — Z1231 Encounter for screening mammogram for malignant neoplasm of breast: Secondary | ICD-10-CM

## 2023-06-15 ENCOUNTER — Ambulatory Visit
Admission: RE | Admit: 2023-06-15 | Discharge: 2023-06-15 | Disposition: A | Discharge status: Home / Self Care | Payer: Medicare Other | Source: Ambulatory Visit | Attending: Endocrinology | Admitting: Endocrinology

## 2023-06-15 DIAGNOSIS — Z1231 Encounter for screening mammogram for malignant neoplasm of breast: Secondary | ICD-10-CM
